# Patient Record
Sex: Female | Born: 1965 | Race: Black or African American | Hispanic: No | Marital: Single | Smoking: Never smoker
Health system: Southern US, Community
[De-identification: ages and names within clinical notes are randomized; demographics above are authoritative.]

## PROBLEM LIST (undated history)

## (undated) DIAGNOSIS — R002 Palpitations: Secondary | ICD-10-CM

## (undated) DIAGNOSIS — M539 Dorsopathy, unspecified: Secondary | ICD-10-CM

## (undated) DIAGNOSIS — Z803 Family history of malignant neoplasm of breast: Secondary | ICD-10-CM

## (undated) DIAGNOSIS — D249 Benign neoplasm of unspecified breast: Secondary | ICD-10-CM

## (undated) HISTORY — DX: Palpitations: R00.2

## (undated) HISTORY — PX: BACK SURGERY: SHX140

## (undated) HISTORY — DX: Benign neoplasm of unspecified breast: D24.9

## (undated) HISTORY — DX: Dorsopathy, unspecified: M53.9

## (undated) HISTORY — DX: Family history of malignant neoplasm of breast: Z80.3

---

## 2006-03-03 ENCOUNTER — Emergency Department: Payer: Self-pay | Admitting: Emergency Medicine

## 2007-07-04 ENCOUNTER — Emergency Department: Payer: Self-pay | Admitting: Emergency Medicine

## 2007-09-30 ENCOUNTER — Emergency Department: Payer: Self-pay | Admitting: Emergency Medicine

## 2008-10-18 ENCOUNTER — Emergency Department: Payer: Self-pay | Admitting: Emergency Medicine

## 2009-09-29 ENCOUNTER — Ambulatory Visit: Payer: Self-pay

## 2009-11-10 ENCOUNTER — Ambulatory Visit: Payer: Self-pay | Admitting: Unknown Physician Specialty

## 2010-06-20 ENCOUNTER — Emergency Department (HOSPITAL_COMMUNITY): Admission: EM | Admit: 2010-06-20 | Discharge: 2010-06-20 | Payer: Self-pay | Admitting: Emergency Medicine

## 2010-07-06 ENCOUNTER — Ambulatory Visit: Payer: Self-pay | Admitting: Family Medicine

## 2010-07-06 DIAGNOSIS — R413 Other amnesia: Secondary | ICD-10-CM

## 2010-07-06 DIAGNOSIS — G44319 Acute post-traumatic headache, not intractable: Secondary | ICD-10-CM

## 2010-07-06 DIAGNOSIS — Z9189 Other specified personal risk factors, not elsewhere classified: Secondary | ICD-10-CM | POA: Insufficient documentation

## 2010-07-06 DIAGNOSIS — S335XXA Sprain of ligaments of lumbar spine, initial encounter: Secondary | ICD-10-CM

## 2010-07-11 ENCOUNTER — Ambulatory Visit: Payer: Self-pay | Admitting: Family Medicine

## 2010-07-11 ENCOUNTER — Encounter (INDEPENDENT_AMBULATORY_CARE_PROVIDER_SITE_OTHER): Payer: Self-pay | Admitting: Family Medicine

## 2010-08-07 ENCOUNTER — Ambulatory Visit: Payer: Self-pay | Admitting: Family Medicine

## 2010-08-10 ENCOUNTER — Encounter: Payer: Self-pay | Admitting: Family Medicine

## 2010-08-10 ENCOUNTER — Telehealth (INDEPENDENT_AMBULATORY_CARE_PROVIDER_SITE_OTHER): Payer: Self-pay | Admitting: Family Medicine

## 2010-08-12 ENCOUNTER — Encounter: Payer: Self-pay | Admitting: Family Medicine

## 2010-08-31 ENCOUNTER — Telehealth: Payer: Self-pay | Admitting: Family Medicine

## 2010-09-05 ENCOUNTER — Ambulatory Visit: Payer: Self-pay | Admitting: Pain Medicine

## 2010-09-11 ENCOUNTER — Ambulatory Visit: Payer: Self-pay | Admitting: Pain Medicine

## 2010-09-12 ENCOUNTER — Encounter: Payer: Self-pay | Admitting: Family Medicine

## 2010-09-13 ENCOUNTER — Ambulatory Visit: Payer: Self-pay | Admitting: Pain Medicine

## 2010-10-10 ENCOUNTER — Ambulatory Visit: Payer: Self-pay | Admitting: Pain Medicine

## 2010-10-23 ENCOUNTER — Ambulatory Visit: Payer: Self-pay | Admitting: Pain Medicine

## 2010-11-02 ENCOUNTER — Ambulatory Visit: Payer: Self-pay | Admitting: Family Medicine

## 2010-11-02 DIAGNOSIS — J309 Allergic rhinitis, unspecified: Secondary | ICD-10-CM | POA: Insufficient documentation

## 2010-11-07 ENCOUNTER — Telehealth: Payer: Self-pay | Admitting: Family Medicine

## 2010-11-11 ENCOUNTER — Ambulatory Visit
Admission: RE | Admit: 2010-11-11 | Discharge: 2010-11-11 | Payer: Self-pay | Source: Home / Self Care | Attending: Internal Medicine | Admitting: Internal Medicine

## 2010-11-12 DIAGNOSIS — D249 Benign neoplasm of unspecified breast: Secondary | ICD-10-CM

## 2010-11-12 HISTORY — DX: Benign neoplasm of unspecified breast: D24.9

## 2010-11-12 HISTORY — PX: BREAST MASS EXCISION: SHX1267

## 2010-11-12 HISTORY — PX: LUMBAR EPIDURAL INJECTION: SHX1980

## 2010-11-12 HISTORY — PX: BREAST BIOPSY: SHX20

## 2010-11-28 ENCOUNTER — Ambulatory Visit: Payer: Self-pay | Admitting: Pain Medicine

## 2010-12-04 ENCOUNTER — Ambulatory Visit: Payer: Self-pay | Admitting: Pain Medicine

## 2010-12-12 NOTE — Progress Notes (Signed)
  Phone Note Call from Patient   Caller: Theresa Cobb Call For: DR. Maryln Manuel Reason for Call: Refill Medication Summary of Call: Patient called for a refill of Tramadol Initial call taken by: Dorna Leitz,  August 31, 2010 2:32 PM  Follow-up for Phone Call        I reviewed the patient's chart and I recommend that the patient follow up with her PCP and be seen for an evaluation.  Her PCP will advise her if she requires a referral to a neurologist or headache specialist.  Please call the patient and if patient is having severe symptoms, advise the patient to go to the ER for evaluation.  Pt would need an OV before any more refills of Tramadol.   Rodney Langton, M.D., F.A.A.F.P.  August 31, 2010   Additional Follow-up for Phone Call Additional follow up Details #1::        Phone Call Completed. Pt was notified of Dr. Barrett Shell. Johonson's advise. Oct. 20TH, 2011 Additional Follow-up by: Levonne Spiller EMT-P,  August 31, 2010 3:13 PM

## 2010-12-12 NOTE — Progress Notes (Signed)
Summary: Refill of tramadol.  Phone Note Call from Patient   Caller: Patient Call For: Medication refill Reason for Call: Refill Medication, Talk to Doctor Summary of Call: Patient reports that she finally went to physical therapy and her pain has actually worsened. I informed her that it may get worse before it gets better and she voiced understanding. She requested a refill of tramadol to take at night as needed for the pain. She takes exedrine back and body during the day at work as needed.     Prescriptions: ULTRACET 37.5-325 MG TABS (TRAMADOL-ACETAMINOPHEN) 1 by mouth three times a day as needed pain  #30 x 0   Entered and Authorized by:   Tacey Ruiz MD   Signed by:   Tacey Ruiz MD on 08/10/2010   Method used:   Electronically to        [facility] [address] (retail)       [address], [address]       [place], [place]  [address]       Ph: [phone]       Fax: [phone]   RxID:   [id number]  The risks, benefits and possible side effects of the treatments and tests were explained clearly to the patient and the patient verbalized understanding.

## 2010-12-12 NOTE — Assessment & Plan Note (Signed)
Summary: ear hurting,congested/jbb   Vital Signs:  Patient profile:   45 year old female Height:      67 inches Weight:      163 pounds O2 Sat:      97 % on Room air Temp:     98.0 degrees F oral Pulse rate:   94 / minute Pulse rhythm:   regular Resp:     18 per minute BP sitting:   136 / 85  (right arm)  Vitals Entered By: Levonne Spiller EMT-P (August 07, 2010 4:02 PM)  O2 Flow:  Room air  Reason for Visit URI, Left ear ache / rwt  Allergies (verified): No Known Drug Allergies PMH-FH-SH reviewed for relevance  Family History: Reviewed history and no changes required.  Social History: Reviewed history from 07/06/2010 and no changes required. Never Smoked Alcohol use-no Drug use-no   Complete Medication List: 1)  Ultracet 37.5-325 Mg Tabs (Tramadol-acetaminophen) .Marland Kitchen.. 1 by mouth three times a day as needed pain 2)  Zithromax Z-pak 250 Mg Tabs (Azithromycin) .... As directed by mouth for infection 3)  Promethazine-dm 6.25-15 Mg/1ml Syrp (Promethazine-dm) .Marland Kitchen.. 1-2 tsp by mouth q8hours as needed cough 4)  Mucinex D 60-600 Mg Xr12h-tab (Pseudoephedrine-guaifenesin) .Marland Kitchen.. 1 by mouth q 12 hours as needed nasal congestion Prescriptions: MUCINEX D 60-600 MG XR12H-TAB (PSEUDOEPHEDRINE-GUAIFENESIN) 1 by mouth q 12 hours as needed nasal congestion  #24 x 0   Entered and Authorized by:   Tacey Ruiz MD   Signed by:   Tacey Ruiz MD on 08/07/2010   Method used:   Electronically to        [facility] [address] (retail)       [address], [address]       [place], [place]  [address]       Ph: [phone]       Fax: [phone]   RxID:   [id number] PROMETHAZINE-DM 6.25-15 MG/5ML SYRP (PROMETHAZINE-DM) 1-2 tsp by mouth Q8hours as needed cough  #10ml x 0   Entered and Authorized by:   Tacey Ruiz MD   Signed by:   Tacey Ruiz MD on 08/07/2010   Method used:   Electronically to        [facility] [address] (retail)       [address],  [address]       [place], [place]  [address]       Ph: [phone]       Fax: [phone]   RxID:   [id number] ZITHROMAX Z-PAK 250 MG TABS (AZITHROMYCIN) as directed by mouth for infection  #1 x 0   Entered and Authorized by:   Tacey Ruiz MD   Signed by:   Tacey Ruiz MD on 08/07/2010   Method used:   Electronically to        [facility] [address] (retail)       [address], [address]       [place], [place]  [address]       Ph: [phone]       Fax: [phone]   RxID:   [id number]   History of Present Illness History from: patient Reason for visit: see chief complaint History of Present Illness: Fri or Sat started to have cough - nonproductive but keeps her up at night. She feels hot and then  cold. + nightsweats. L ear hurts no drainage. + bad headaches. + sneezing     REVIEW OF SYSTEMS Constitutional Symptoms       Complains of fever, chills, night sweats, and fatigue.     Denies weight loss and weight gain.  Eyes       Denies change in vision, eye pain, eye discharge, glasses, contact lenses, and eye surgery. Ear/Nose/Throat/Mouth       Complains of ear pain, dizziness, frequent runny nose, sinus problems, and tooth pain or bleeding.      Denies hearing loss/aids, change in hearing, ear discharge, frequent nose bleeds, sore throat, and hoarseness.  Respiratory       Complains of dry cough.      Denies productive cough, wheezing, shortness of breath, asthma, bronchitis, and emphysema/COPD.  Cardiovascular       Denies murmurs, chest pain, and tires easily with exhertion.    Gastrointestinal       Denies nausea/vomiting. Neurological       Complains of headaches.      Comments: different headaches than before   Assessment New Problems: ACUTE SINUSITIS, UNSPECIFIED (ICD-461.9)   Plan New Medications/Changes: MUCINEX D 60-600 MG XR12H-TAB (PSEUDOEPHEDRINE-GUAIFENESIN) 1 by mouth q 12 hours as needed  nasal congestion  #24 x 0, 08/07/2010, Tacey Ruiz MD PROMETHAZINE-DM 6.25-15 MG/5ML SYRP (PROMETHAZINE-DM) 1-2 tsp by mouth Q8hours as needed cough  #74ml x 0, 08/07/2010, Tacey Ruiz MD ZITHROMAX Z-PAK 250 MG TABS (AZITHROMYCIN) as directed by mouth for infection  #1 x 0, 08/07/2010, Tacey Ruiz MD  New Orders: Est. Patient Level III [phone]  The patient and/or caregiver has been counseled thoroughly with regard to medications prescribed including dosage, schedule, interactions, rationale for use, and possible side effects and they verbalize understanding.  Diagnoses and expected course of recovery discussed and will return if not improved as expected or if the condition worsens. Patient and/or caregiver verbalized understanding.   Physical Exam General appearance: well developed, well nourished, no acute distress Head: tender frontal > maxillary sinus tenderness Eyes: conjunctivae and lids normal Ears: normal, no lesions or deformities Oral/Pharynx: erythema with clear PND Neck: neck supple,  trachea midline, no masses Chest/Lungs: no rales, wheezes, or rhonchi bilateral, breath sounds equal without effort Heart: regular rate and  rhythm, no murmur Skin: no obvious rashes or lesions MSE: oriented to time, place, and person
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
## 2010-12-14 NOTE — Progress Notes (Signed)
  Phone Note Call from Patient   Caller: Patient Reason for Call: Refill Medication Summary of Call: Patient called stating her ears are still hurting.  She is still coughing up green mucus.  She needs a refill on Fluticasone and Azithromycin 250 mg tabs.  Theresa Cobb dob is 09/02/1966.  You can reach her at [phone].  She stated to call her before 5:00 today if she needs to come in to see the doctor. Initial call taken by: Dorna Leitz,  November 07, 2010 12:40 PM  Follow-up for Phone Call        Pt. was advised that the antibiotic will continue in her system for a few more days. If she feels that she needs to be seen? She can come into the clinic. Pt. was advised that if she is not any better in 2-3days or worse, To come into the clinic for re-evaluation. / rwt Follow-up by: Levonne Spiller EMT-P,  November 07, 2010 12:55 PM
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
## 2010-12-16 ENCOUNTER — Encounter: Payer: Self-pay | Admitting: Family Medicine

## 2010-12-16 ENCOUNTER — Ambulatory Visit: Payer: Self-pay | Admitting: Family Medicine

## 2010-12-16 DIAGNOSIS — J309 Allergic rhinitis, unspecified: Secondary | ICD-10-CM

## 2010-12-16 DIAGNOSIS — J019 Acute sinusitis, unspecified: Secondary | ICD-10-CM

## 2010-12-20 ENCOUNTER — Telehealth: Payer: Self-pay | Admitting: Family Medicine

## 2010-12-20 NOTE — Assessment & Plan Note (Signed)
Summary: POSSIBLE SINUS INFECTION/EVM   Vital Signs:  Patient profile:   45 year old female Height:      65.5 inches Weight:      154 pounds BMI:     25.33 O2 Sat:      100 % on Room air Pulse rate:   88 / minute Pulse rhythm:   regular BP sitting:   117 / 75  (left arm) Cuff size:   regular  O2 Flow:  Room air History of Present Illness History from: patient Reason for visit: see chief complaint Chief Complaint: vomiting yesterday, ear ache, sore throat History of Present Illness: The patient presented today because she was sent home from work with cough and head congestion. The patient says that the symptoms started yesterday.  She is having chills, ear pain and sinus pressure and drainage.  The patient says that she is taking Zyrtec but not taking her nasal spray and reports that she has been feeling well up until yesterday.  She says that she is not having any other symptoms at this time.  She reports that she feels like she felt with a sinus infection.  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      Current Medications (verified): 1)  Mucinex D 60-600 Mg Xr12h-Tab (Pseudoephedrine-Guaifenesin) .Marland Kitchen.. 1 By Mouth Q 12 Hours As Needed Nasal Congestion 2)  Fluticasone Propionate 50 Mcg/act Susp (Fluticasone Propionate) .... 2 Sprays Per Nostril Once Daily 3)  Cetirizine Hcl 10 Mg Tabs (Cetirizine Hcl) .... Take 1 By Mouth Daily As Needed For Allergies 4)  Ventolin Hfa 108 (90 Base) Mcg/act Aers (Albuterol Sulfate) .... 2 Puffs Every 4 Hours As Needed Cough, Wheezing, Sob 5)  Ultracet 37.5-325 Mg Tabs (Tramadol-Acetaminophen) .... Tablet By Mouth As Needed For Pain  Allergies (verified): No Known Drug Allergies  Past History:  Family History: Last updated: 11/02/2010 Mother - Breast Cancer   Social History: Last updated: 12/16/2010 Never Smoked Alcohol use-no Drug  use-no Occupation: Works at [employer]  Risk Factors: Smoking Status: never (07/06/2010)  Past Medical History: Reviewed history from 11/02/2010 and no changes required. Allergic rhinitis Chronic Lower Back Pain on epidural injections Memory Problems  Past Surgical History: Reviewed history from 07/06/2010 and no changes required. Denies surgical history  Family History: Reviewed history from 11/02/2010 and no changes required. Mother - Breast Cancer   Social History: Never Smoked Alcohol use-no Drug use-no Occupation: Works at [employer]  Review of [employer]:  Denies chills, fatigue, fever, loss of appetite, malaise, sleep disorder, sweats, weakness, and weight loss. Eyes:  Denies blurring, discharge, double vision, eye irritation, eye pain, halos, itching, light sensitivity, red eye, vision loss-1 eye, and vision loss-both eyes. ENT:  Complains of earache and sore throat; denies decreased hearing, difficulty swallowing, ear discharge, hoarseness, nasal congestion, nosebleeds, postnasal drainage, ringing in ears, and sinus pressure. CV:  Denies bluish discoloration of lips or nails, chest pain or discomfort, difficulty breathing at night, difficulty breathing while lying down, fainting, fatigue, leg cramps with exertion, lightheadness, near fainting, palpitations, shortness of breath with exertion, swelling of feet, swelling of hands, and weight gain. Resp:  Denies chest discomfort, chest pain with inspiration, cough, coughing up blood, excessive snoring, hypersomnolence, morning headaches, pleuritic, shortness of breath, sputum productive, and wheezing. GI:  Denies abdominal pain, bloody stools, change in bowel habits, constipation, dark tarry stools, diarrhea, excessive appetite,  gas, hemorrhoids, indigestion, loss of appetite, nausea, vomiting, vomiting blood, and yellowish skin color. GU:  Denies abnormal vaginal bleeding, decreased libido, discharge, dysuria, genital sores,  hematuria, incontinence, nocturia, urinary frequency, and urinary hesitancy. MS:  Denies joint pain, joint redness, joint swelling, loss of strength, low back pain, mid back pain, muscle aches, muscle , cramps, muscle weakness, stiffness, and thoracic pain. Neuro:  Denies brief paralysis, difficulty with concentration, disturbances in coordination, falling down, headaches, inability to speak, memory loss, numbness, poor balance, seizures, sensation of room spinning, tingling, tremors, visual disturbances, and weakness. Psych:  Denies alternate hallucination ( auditory/visual), anxiety, depression, easily angered, easily tearful, irritability, mental problems, panic attacks, sense of great danger, suicidal thoughts/plans, thoughts of violence, unusual visions or sounds, and thoughts /plans of harming others. Allergy:  Complains of persistent infections, seasonal allergies, and sneezing.  Physical Exam  General:  Well-developed,well-nourished,in no acute distress; alert,appropriate and cooperative throughout examination Head:  Normocephalic and atraumatic without obvious abnormalities. No apparent alopecia or balding. Eyes:  No corneal or conjunctival inflammation noted. EOMI. Perrla. Funduscopic exam benign, without hemorrhages, exudates or papilledema. Vision grossly normal. Ears:  External ear exam shows no significant lesions or deformities.  Otoscopic examination reveals clear canals, tympanic membranes are intact bilaterally without bulging, retraction, inflammation or discharge. Hearing is grossly normal bilaterally. Nose:  large swollen red nasal turbinates bilateral, nasal discharge mucosal pallor and mucosal edema.   Mouth:  Oral mucosa and oropharynx without lesions or exudates.  Teeth in good repair. Neck:  No deformities, masses, or tenderness noted. Lungs:  Normal respiratory effort, chest expands symmetrically. Lungs are clear to auscultation, no crackles or wheezes. Heart:  Normal rate  and regular rhythm. S1 and S2 normal without gallop, murmur, click, rub or other extra sounds. Neurologic:  No cranial nerve deficits noted. Station and gait are normal.  Sensory, motor and coordinative functions appear intact. Cervical Nodes:  mild tender anterior cervical nodes noted Psych:  Cognition and judgment appear intact. Alert and cooperative with normal attention span and concentration. No apparent delusions, illusions, hallucinations   Problems:  Medical Problems Added: 1)  ? of Acute Sinusitis, Unspecified  (ICD-461.9)  Impression & Recommendations:  Problem # 1:  ALLERGIC RHINITIS CAUSE UNSPECIFIED (ICD-477.9)  Her updated medication list for this problem includes:    Fluticasone Propionate 50 Mcg/act Susp (Fluticasone propionate) .Marland Kitchen... 2 sprays per nostril once daily    Cetirizine Hcl 10 Mg Tabs (Cetirizine hcl) .Marland Kitchen... Take 1 by mouth daily as needed for allergies    Astelin 137 Mcg/spray Soln (Azelastine hcl) .Marland Kitchen... Take 1 spray in each nostril once daily before bed    Fluticasone Propionate 50 Mcg/act Susp (Fluticasone propionate) .Marland Kitchen... 2 sprays per nostril every morning once per day  Problem # 2:  ? of ACUTE SINUSITIS, UNSPECIFIED (ICD-461.9) I explained to the patient that I did not believe that she currently had a sinus infection but that she was at high risk for developing a sinus infection and that if she developed symptoms to go ahead and get the prescription.  The following medications were removed from the medication list:    Promethazine-dm 6.25-15 Mg/69ml Syrp (Promethazine-dm) .Marland Kitchen... 1-2 tsp by mouth q8hours as needed cough Her updated medication list for this problem includes:    Mucinex D 60-600 Mg Xr12h-tab (Pseudoephedrine-guaifenesin) .Marland Kitchen... 1 by mouth q 12 hours as needed nasal congestion    Fluticasone Propionate 50 Mcg/act Susp (Fluticasone propionate) .Marland Kitchen... 2 sprays per nostril once daily    Amoxicillin 875  Mg Tabs (Amoxicillin) .Marland Kitchen... Take 1 by mouth two  times a day until completed    Astelin 137 Mcg/spray Soln (Azelastine hcl) .Marland Kitchen... Take 1 spray in each nostril once daily before bed    Zyrtec-d Allergy & Congestion 5-120 Mg Xr12h-tab (Cetirizine-pseudoephedrine) .Marland Kitchen... Take 1 by mouth every 12 hours    Fluticasone Propionate 50 Mcg/act Susp (Fluticasone propionate) .Marland Kitchen... 2 sprays per nostril every morning once per day  Complete Medication List: 1)  Mucinex D 60-600 Mg Xr12h-tab (Pseudoephedrine-guaifenesin) .Marland Kitchen.. 1 by mouth q 12 hours as needed nasal congestion 2)  Fluticasone Propionate 50 Mcg/act Susp (Fluticasone propionate) .... 2 sprays per nostril once daily 3)  Cetirizine Hcl 10 Mg Tabs (Cetirizine hcl) .... Take 1 by mouth daily as needed for allergies 4)  Ventolin Hfa 108 (90 Base) Mcg/act Aers (Albuterol sulfate) .... 2 puffs every 4 hours as needed cough, wheezing, sob 5)  Ultracet 37.5-325 Mg Tabs (Tramadol-acetaminophen) .... Tablet by mouth as needed for pain 6)  Amoxicillin 875 Mg Tabs (Amoxicillin) .... Take 1 by mouth two times a day until completed 7)  Astelin 137 Mcg/spray Soln (Azelastine hcl) .... Take 1 spray in each nostril once daily before bed 8)  Zyrtec-d Allergy & Congestion 5-120 Mg Xr12h-tab (Cetirizine-pseudoephedrine) .... Take 1 by mouth every 12 hours 9)  Fluticasone Propionate 50 Mcg/act Susp (Fluticasone propionate) .... 2 sprays per nostril every morning once per day  Patient Instructions: 1)  Oral Rehydration Solution: drink 1/2 ounce every 15 minutes. If tolerated afert 1 hour, drink 1 ounce every 15 minutes. As you can tolerate, keep adding 1/2 ounce every 15 minutes, up to a total of 2-4 ounces. Contact the office if unable to tolerate oral solution, if you keep vomiting, or you continue to have signs of dehydration. 2)  Take your antibiotic as prescribed until ALL of it is gone, but stop if you develop a rash or swelling and contact our office as soon as possible. 3)  Acute sinusitis symptoms for less than  10 days are not helped by antibiotics.Use warm moist compresses, and over the counter decongestants ( only as directed). Call if no improvement in 5-7 days, sooner if increasing pain, fever, or new symptoms. 4)  Go to the pharmacy and pick up your prescription (s).  It may take up to 30 mins for electronic prescriptions to be delivered to the pharmacy.  Please call if your pharmacy has not received your prescriptions after 30 minutes.   5)  Return or go to the ER if no improvement or symptoms getting worse.   6)  The risks, benefits and possible side effects were clearly explained and discussed with the patient.  The patient verbalized clear understanding.  The patient was given instructions to return if symptoms don't improve, worsen or new changes develop.  If it is not during clinic hours and the patient cannot get back to this clinic then the patient was told to seek medical care at an available urgent care or emergency department.  The patient verbalized understanding.  7)  The patient was informed that there is no on-call provider or services available at this clinic during off-hours (when the clinic is closed).  If the patient developed a problem or concern that required immediate attention, the patient was advised to go the the nearest available urgent care or emergency department for medical care.  The patient verbalized understanding.    Prescriptions: FLUTICASONE PROPIONATE 50 MCG/ACT SUSP (FLUTICASONE PROPIONATE) 2 sprays per nostril every morning  once per day  #1 x 0   Entered and Authorized by:   Standley Dakins MD   Signed by:   Standley Dakins MD on 12/16/2010   Method used:   Electronically to        [facility] [address] (retail)       [address], [address]       [place], [place]  [address]       Ph: [phone]       Fax: [phone]   RxID:   [id number] ZYRTEC-D ALLERGY & CONGESTION 5-120 MG XR12H-TAB (CETIRIZINE-PSEUDOEPHEDRINE) take 1 by  mouth every 12 hours  #6 x 0   Entered and Authorized by:   Standley Dakins MD   Signed by:   Standley Dakins MD on 12/16/2010   Method used:   Electronically to        [facility] [address] ([facility]ail)       [address], [address]       [place], [place]  [address]       Ph: [phone]       Fax: [phone]   RxID:   [id number] ASTELIN 137 MCG/SPRAY SOLN (AZELASTINE HCL) take 1 spray in each nostril once daily before bed  #1 x 0   Entered and Authorized by:   Standley Dakins MD   Signed by:   Standley Dakins MD on 12/16/2010   Method used:   Electronically to        [facility] [address] ([facility]ail)       [address], [address]       [place], [place]  [address]       Ph: [phone]       Fax: [phone]   RxID:   [id number] AMOXICILLIN 875 MG TABS (AMOXICILLIN) take 1 by mouth two times a day until completed  #20 x 0   Entered and Authorized by:   Standley Dakins MD   Signed by:   Standley Dakins MD on 12/16/2010   Method used:   Electronically to        [facility] [address] ([facility]ail)       [address], [address]       [place], [place]  [address]       Ph: [phone]       Fax: [phone]   RxID:   [id number]

## 2010-12-28 NOTE — Progress Notes (Signed)
       New/Updated Medications: AZITHROMYCIN 250 MG TABS (AZITHROMYCIN) take 2 tabs by mouth on day 1, then take 1 tab by mouth daily until completed Prescriptions: AZITHROMYCIN 250 MG TABS (AZITHROMYCIN) take 2 tabs by mouth on day 1, then take 1 tab by mouth daily until completed  #6 x 0   Entered and Authorized by:   Standley Dakins MD   Signed by:   Standley Dakins MD on 12/20/2010   Method used:   Electronically to        [facility] (retail)       [address], [address]       [place], [place]  [address]       Ph: [phone]       Fax: [phone]   RxID:   [id number]   The patient came in today requesting that she have an Rx for a Z pack sent to the pharmacy.  She said that she was sent home from work today with fever.  I told the patient that we will send the Rx in but if no improvement after 3-5 days then patient will need to come in for another office visit.  The patient verbalized clear understanding.  Rodney Langton, MD, CDE, [employer]

## 2011-01-02 ENCOUNTER — Ambulatory Visit: Payer: Self-pay | Admitting: Pain Medicine

## 2011-01-15 ENCOUNTER — Ambulatory Visit: Payer: Self-pay | Admitting: Pain Medicine

## 2011-01-22 ENCOUNTER — Emergency Department (HOSPITAL_COMMUNITY)
Admission: EM | Admit: 2011-01-22 | Discharge: 2011-01-22 | Disposition: A | Discharge status: Home / Self Care | Payer: Medicaid Other | Attending: Emergency Medicine | Admitting: Emergency Medicine

## 2011-01-22 DIAGNOSIS — Z79899 Other long term (current) drug therapy: Secondary | ICD-10-CM | POA: Insufficient documentation

## 2011-01-22 DIAGNOSIS — M545 Low back pain, unspecified: Secondary | ICD-10-CM | POA: Insufficient documentation

## 2011-01-22 DIAGNOSIS — G8929 Other chronic pain: Secondary | ICD-10-CM | POA: Insufficient documentation

## 2011-01-22 DIAGNOSIS — R110 Nausea: Secondary | ICD-10-CM | POA: Insufficient documentation

## 2011-01-26 LAB — URINALYSIS, ROUTINE W REFLEX MICROSCOPIC
Bilirubin Urine: NEGATIVE
Glucose, UA: NEGATIVE mg/dL
Leukocytes, UA: NEGATIVE
Protein, ur: NEGATIVE mg/dL
Urobilinogen, UA: 0.2 mg/dL (ref 0.0–1.0)
pH: 6 (ref 5.0–8.0)

## 2011-01-26 LAB — URINE MICROSCOPIC-ADD ON

## 2011-02-15 ENCOUNTER — Ambulatory Visit: Payer: Self-pay | Admitting: Pain Medicine

## 2011-02-22 ENCOUNTER — Ambulatory Visit: Payer: Self-pay

## 2011-02-26 ENCOUNTER — Ambulatory Visit: Payer: Self-pay | Admitting: Pain Medicine

## 2011-03-06 ENCOUNTER — Ambulatory Visit: Payer: Self-pay | Admitting: Family Medicine

## 2011-03-20 ENCOUNTER — Ambulatory Visit: Payer: Self-pay | Admitting: Pain Medicine

## 2011-03-22 ENCOUNTER — Ambulatory Visit: Payer: Self-pay | Admitting: General Surgery

## 2011-03-23 LAB — PATHOLOGY REPORT

## 2011-03-26 ENCOUNTER — Ambulatory Visit: Payer: Self-pay | Admitting: Pain Medicine

## 2011-05-01 ENCOUNTER — Ambulatory Visit: Payer: Self-pay | Admitting: Pain Medicine

## 2011-05-09 ENCOUNTER — Ambulatory Visit: Payer: Self-pay | Admitting: Pain Medicine

## 2011-05-31 ENCOUNTER — Ambulatory Visit: Payer: Self-pay | Admitting: Pain Medicine

## 2011-06-11 ENCOUNTER — Ambulatory Visit: Payer: Self-pay | Admitting: Pain Medicine

## 2011-07-04 ENCOUNTER — Other Ambulatory Visit: Payer: Self-pay | Admitting: Neurosurgery

## 2011-07-04 DIAGNOSIS — M549 Dorsalgia, unspecified: Secondary | ICD-10-CM

## 2011-07-04 DIAGNOSIS — M5410 Radiculopathy, site unspecified: Secondary | ICD-10-CM

## 2011-07-10 ENCOUNTER — Ambulatory Visit: Payer: Self-pay | Admitting: Pain Medicine

## 2011-07-13 ENCOUNTER — Other Ambulatory Visit: Payer: Medicaid Other

## 2011-07-18 ENCOUNTER — Ambulatory Visit: Payer: Self-pay | Admitting: Pain Medicine

## 2011-08-14 ENCOUNTER — Ambulatory Visit: Payer: Self-pay | Admitting: Pain Medicine

## 2011-08-20 ENCOUNTER — Ambulatory Visit: Payer: Self-pay | Admitting: Pain Medicine

## 2011-08-22 ENCOUNTER — Emergency Department: Payer: Self-pay | Admitting: Internal Medicine

## 2011-09-11 ENCOUNTER — Ambulatory Visit: Payer: Self-pay | Admitting: Pain Medicine

## 2011-09-12 ENCOUNTER — Ambulatory Visit: Payer: Self-pay | Admitting: Pain Medicine

## 2011-10-16 ENCOUNTER — Ambulatory Visit: Payer: Self-pay | Admitting: Pain Medicine

## 2011-10-24 ENCOUNTER — Ambulatory Visit: Payer: Self-pay | Admitting: Pain Medicine

## 2011-11-27 ENCOUNTER — Ambulatory Visit: Payer: Self-pay | Admitting: Pain Medicine

## 2011-12-05 ENCOUNTER — Ambulatory Visit: Payer: Self-pay | Admitting: Pain Medicine

## 2012-01-03 ENCOUNTER — Ambulatory Visit: Payer: Self-pay | Admitting: Pain Medicine

## 2012-01-11 ENCOUNTER — Emergency Department: Payer: Self-pay | Admitting: Emergency Medicine

## 2012-01-16 ENCOUNTER — Ambulatory Visit: Payer: Self-pay | Admitting: Pain Medicine

## 2012-01-25 ENCOUNTER — Ambulatory Visit: Payer: Self-pay | Admitting: Pain Medicine

## 2012-02-03 ENCOUNTER — Emergency Department: Payer: Self-pay | Admitting: Emergency Medicine

## 2012-02-04 ENCOUNTER — Ambulatory Visit: Payer: Self-pay | Admitting: Pain Medicine

## 2012-02-12 ENCOUNTER — Ambulatory Visit: Payer: Self-pay | Admitting: Pain Medicine

## 2012-02-25 ENCOUNTER — Ambulatory Visit: Payer: Self-pay | Admitting: General Surgery

## 2012-02-28 ENCOUNTER — Ambulatory Visit: Payer: Self-pay | Admitting: Pain Medicine

## 2012-03-12 ENCOUNTER — Ambulatory Visit: Payer: Self-pay | Admitting: Pain Medicine

## 2012-04-03 ENCOUNTER — Ambulatory Visit: Payer: Self-pay | Admitting: Pain Medicine

## 2012-04-14 ENCOUNTER — Ambulatory Visit: Payer: Self-pay | Admitting: Pain Medicine

## 2012-05-01 ENCOUNTER — Ambulatory Visit: Payer: Self-pay | Admitting: Pain Medicine

## 2012-05-07 ENCOUNTER — Ambulatory Visit: Payer: Self-pay | Admitting: Pain Medicine

## 2012-05-29 ENCOUNTER — Ambulatory Visit: Payer: Self-pay | Admitting: Pain Medicine

## 2012-06-04 ENCOUNTER — Ambulatory Visit: Payer: Self-pay | Admitting: Pain Medicine

## 2012-06-26 ENCOUNTER — Ambulatory Visit: Payer: Self-pay | Admitting: Pain Medicine

## 2012-07-16 ENCOUNTER — Ambulatory Visit: Payer: Self-pay | Admitting: Pain Medicine

## 2012-08-14 ENCOUNTER — Ambulatory Visit: Payer: Self-pay | Admitting: Pain Medicine

## 2012-09-18 ENCOUNTER — Ambulatory Visit: Payer: Self-pay | Admitting: Pain Medicine

## 2012-10-20 ENCOUNTER — Ambulatory Visit: Payer: Self-pay | Admitting: Pain Medicine

## 2012-11-18 ENCOUNTER — Ambulatory Visit: Payer: Self-pay | Admitting: Pain Medicine

## 2012-11-19 ENCOUNTER — Ambulatory Visit: Payer: Self-pay | Admitting: Pain Medicine

## 2012-12-18 ENCOUNTER — Ambulatory Visit: Payer: Self-pay | Admitting: Pain Medicine

## 2012-12-21 ENCOUNTER — Encounter: Payer: Self-pay | Admitting: *Deleted

## 2012-12-21 DIAGNOSIS — D249 Benign neoplasm of unspecified breast: Secondary | ICD-10-CM | POA: Insufficient documentation

## 2012-12-24 ENCOUNTER — Ambulatory Visit: Payer: Self-pay | Admitting: Pain Medicine

## 2013-01-12 ENCOUNTER — Ambulatory Visit: Payer: Self-pay | Admitting: Pain Medicine

## 2013-01-26 ENCOUNTER — Ambulatory Visit: Payer: Self-pay | Admitting: Pain Medicine

## 2013-02-16 ENCOUNTER — Ambulatory Visit: Payer: Self-pay | Admitting: Pain Medicine

## 2013-02-23 ENCOUNTER — Ambulatory Visit: Payer: Self-pay | Admitting: Pain Medicine

## 2013-02-26 ENCOUNTER — Ambulatory Visit: Payer: Self-pay | Admitting: General Surgery

## 2013-03-02 ENCOUNTER — Encounter: Payer: Self-pay | Admitting: *Deleted

## 2013-03-03 ENCOUNTER — Encounter: Payer: Self-pay | Admitting: General Surgery

## 2013-03-03 NOTE — Progress Notes (Signed)
Quick Note:  Make sure the additional views are done prior to her office visit ______ 

## 2013-03-04 NOTE — Progress Notes (Signed)
Patient to call [facility] to arrange additional views. She will call the office back to reschedule office visit follow up.

## 2013-03-05 ENCOUNTER — Ambulatory Visit: Payer: Self-pay | Admitting: General Surgery

## 2013-03-10 ENCOUNTER — Ambulatory Visit: Payer: Self-pay | Admitting: General Surgery

## 2013-03-12 ENCOUNTER — Ambulatory Visit: Payer: Self-pay | Admitting: Pain Medicine

## 2013-03-12 ENCOUNTER — Encounter: Payer: Self-pay | Admitting: *Deleted

## 2013-03-17 ENCOUNTER — Encounter: Payer: Self-pay | Admitting: General Surgery

## 2013-03-23 ENCOUNTER — Ambulatory Visit: Payer: Self-pay | Admitting: Pain Medicine

## 2013-04-16 ENCOUNTER — Ambulatory Visit: Payer: Self-pay | Admitting: Pain Medicine

## 2013-04-27 ENCOUNTER — Ambulatory Visit: Payer: Self-pay | Admitting: Pain Medicine

## 2013-05-12 ENCOUNTER — Ambulatory Visit: Payer: Self-pay | Admitting: Pain Medicine

## 2013-06-03 ENCOUNTER — Ambulatory Visit: Payer: Self-pay | Admitting: Pain Medicine

## 2013-06-10 ENCOUNTER — Ambulatory Visit: Payer: Self-pay | Admitting: Pain Medicine

## 2013-07-06 ENCOUNTER — Other Ambulatory Visit: Payer: Self-pay | Admitting: Neurosurgery

## 2013-07-06 LAB — HCG, QUANTITATIVE, PREGNANCY: Beta Hcg, Quant.: <1 m[IU]/mL — ABNORMAL LOW

## 2013-07-07 ENCOUNTER — Ambulatory Visit: Payer: Self-pay | Admitting: Neurosurgery

## 2013-07-08 ENCOUNTER — Ambulatory Visit: Payer: Self-pay | Admitting: Pain Medicine

## 2013-07-15 ENCOUNTER — Encounter: Payer: Self-pay | Admitting: Neurosurgery

## 2013-08-13 ENCOUNTER — Ambulatory Visit: Payer: Self-pay | Admitting: Pain Medicine

## 2013-08-14 ENCOUNTER — Encounter: Payer: Self-pay | Admitting: Neurosurgery

## 2013-08-24 ENCOUNTER — Ambulatory Visit: Payer: Self-pay | Admitting: Pain Medicine

## 2013-09-08 ENCOUNTER — Ambulatory Visit: Payer: Self-pay | Admitting: Pain Medicine

## 2013-09-28 ENCOUNTER — Ambulatory Visit: Payer: Self-pay | Admitting: Pain Medicine

## 2013-10-29 ENCOUNTER — Ambulatory Visit: Payer: Self-pay | Admitting: Pain Medicine

## 2013-11-09 ENCOUNTER — Ambulatory Visit: Payer: Self-pay | Admitting: Pain Medicine

## 2013-12-01 ENCOUNTER — Ambulatory Visit: Payer: Self-pay | Admitting: Pain Medicine

## 2013-12-07 ENCOUNTER — Ambulatory Visit: Payer: Self-pay | Admitting: Pain Medicine

## 2013-12-28 ENCOUNTER — Encounter (HOSPITAL_COMMUNITY): Payer: Self-pay | Admitting: Emergency Medicine

## 2013-12-28 ENCOUNTER — Emergency Department (HOSPITAL_COMMUNITY)
Admission: EM | Admit: 2013-12-28 | Discharge: 2013-12-28 | Disposition: A | Discharge status: Home / Self Care | Payer: Medicaid Other | Attending: Emergency Medicine | Admitting: Emergency Medicine

## 2013-12-28 ENCOUNTER — Emergency Department (HOSPITAL_COMMUNITY): Payer: Medicaid Other

## 2013-12-28 DIAGNOSIS — S8990XA Unspecified injury of unspecified lower leg, initial encounter: Secondary | ICD-10-CM | POA: Insufficient documentation

## 2013-12-28 DIAGNOSIS — S99929A Unspecified injury of unspecified foot, initial encounter: Secondary | ICD-10-CM

## 2013-12-28 DIAGNOSIS — Y9389 Activity, other specified: Secondary | ICD-10-CM | POA: Insufficient documentation

## 2013-12-28 DIAGNOSIS — Y9289 Other specified places as the place of occurrence of the external cause: Secondary | ICD-10-CM | POA: Insufficient documentation

## 2013-12-28 DIAGNOSIS — S335XXA Sprain of ligaments of lumbar spine, initial encounter: Secondary | ICD-10-CM | POA: Insufficient documentation

## 2013-12-28 DIAGNOSIS — S99919A Unspecified injury of unspecified ankle, initial encounter: Secondary | ICD-10-CM

## 2013-12-28 DIAGNOSIS — S39012A Strain of muscle, fascia and tendon of lower back, initial encounter: Secondary | ICD-10-CM

## 2013-12-28 DIAGNOSIS — Z8742 Personal history of other diseases of the female genital tract: Secondary | ICD-10-CM | POA: Insufficient documentation

## 2013-12-28 DIAGNOSIS — S0990XA Unspecified injury of head, initial encounter: Secondary | ICD-10-CM | POA: Insufficient documentation

## 2013-12-28 DIAGNOSIS — Z79899 Other long term (current) drug therapy: Secondary | ICD-10-CM | POA: Insufficient documentation

## 2013-12-28 DIAGNOSIS — Z9889 Other specified postprocedural states: Secondary | ICD-10-CM | POA: Insufficient documentation

## 2013-12-28 MED ORDER — IBUPROFEN 600 MG PO TABS: 600.0000 mg | ORAL_TABLET | Freq: Four times a day (QID) | ORAL | Status: DC | PRN

## 2013-12-28 MED ORDER — CYCLOBENZAPRINE HCL 10 MG PO TABS: 10.0000 mg | ORAL_TABLET | Freq: Two times a day (BID) | ORAL | Status: DC | PRN

## 2013-12-28 MED ORDER — OXYCODONE-ACETAMINOPHEN 5-325 MG PO TABS
1.0000 | ORAL_TABLET | Freq: Once | ORAL | Status: AC
  Administered 2013-12-28: 1 via ORAL
  Filled 2013-12-28: qty 1

## 2013-12-28 MED ORDER — HYDROCODONE-ACETAMINOPHEN 5-325 MG PO TABS: 1.0000 | ORAL_TABLET | ORAL | Status: DC | PRN
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
## 2013-12-29 NOTE — ED Provider Notes (Signed)
Medical screening examination/treatment/procedure(s) were performed by non-physician practitioner and as supervising physician I was immediately available for consultation/collaboration.  EKG Interpretation   None        Virgel Manifold, MD 12/29/13 1324

## 2013-12-31 ENCOUNTER — Ambulatory Visit: Payer: Self-pay | Admitting: Pain Medicine

## 2014-01-04 ENCOUNTER — Ambulatory Visit: Payer: Self-pay | Admitting: Pain Medicine

## 2014-02-02 ENCOUNTER — Ambulatory Visit: Payer: Self-pay | Admitting: Pain Medicine

## 2014-03-01 ENCOUNTER — Ambulatory Visit: Payer: Self-pay | Admitting: Family Medicine

## 2014-03-02 ENCOUNTER — Ambulatory Visit: Payer: Self-pay | Admitting: Pain Medicine

## 2014-03-09 ENCOUNTER — Encounter: Payer: Self-pay | Admitting: Neurosurgery

## 2014-03-10 ENCOUNTER — Ambulatory Visit: Payer: Self-pay | Admitting: Pain Medicine

## 2014-03-24 ENCOUNTER — Ambulatory Visit: Payer: Self-pay | Admitting: Family Medicine

## 2014-03-31 ENCOUNTER — Ambulatory Visit: Payer: Self-pay | Admitting: Pain Medicine

## 2014-04-12 ENCOUNTER — Ambulatory Visit: Payer: Self-pay | Admitting: Pain Medicine

## 2014-04-29 ENCOUNTER — Ambulatory Visit: Payer: Self-pay | Admitting: Pain Medicine

## 2014-05-27 ENCOUNTER — Ambulatory Visit: Payer: Self-pay | Admitting: Pain Medicine

## 2014-06-02 ENCOUNTER — Ambulatory Visit: Payer: Self-pay | Admitting: Pain Medicine

## 2014-06-28 ENCOUNTER — Ambulatory Visit: Payer: Self-pay | Admitting: Pain Medicine

## 2014-07-27 ENCOUNTER — Ambulatory Visit: Payer: Self-pay | Admitting: Pain Medicine

## 2014-08-23 ENCOUNTER — Ambulatory Visit: Payer: Self-pay | Admitting: Pain Medicine

## 2014-09-13 ENCOUNTER — Encounter (HOSPITAL_COMMUNITY): Payer: Self-pay | Admitting: Emergency Medicine

## 2014-09-14 ENCOUNTER — Ambulatory Visit: Payer: Self-pay | Admitting: Pain Medicine

## 2014-10-19 ENCOUNTER — Ambulatory Visit: Payer: Self-pay | Admitting: Pain Medicine

## 2014-11-08 ENCOUNTER — Ambulatory Visit: Payer: Self-pay | Admitting: Pain Medicine

## 2014-12-20 ENCOUNTER — Ambulatory Visit: Payer: Self-pay | Admitting: Pain Medicine

## 2015-01-18 ENCOUNTER — Ambulatory Visit: Payer: Self-pay | Admitting: Pain Medicine

## 2015-02-17 ENCOUNTER — Ambulatory Visit
Admit: 2015-02-17 | Disposition: A | Discharge status: Home / Self Care | Payer: Self-pay | Attending: Pain Medicine | Admitting: Pain Medicine

## 2015-02-28 ENCOUNTER — Ambulatory Visit
Admit: 2015-02-28 | Disposition: A | Discharge status: Home / Self Care | Payer: Self-pay | Attending: Family Medicine | Admitting: Family Medicine

## 2015-03-18 DIAGNOSIS — M5137 Other intervertebral disc degeneration, lumbosacral region: Secondary | ICD-10-CM | POA: Insufficient documentation

## 2015-03-18 DIAGNOSIS — M1990 Unspecified osteoarthritis, unspecified site: Secondary | ICD-10-CM | POA: Insufficient documentation

## 2015-03-18 DIAGNOSIS — M5481 Occipital neuralgia: Secondary | ICD-10-CM | POA: Insufficient documentation

## 2015-03-18 DIAGNOSIS — M533 Sacrococcygeal disorders, not elsewhere classified: Secondary | ICD-10-CM | POA: Insufficient documentation

## 2015-03-18 NOTE — Patient Instructions (Addendum)
Continue present medications and take prescribed antibiotic.  F/U Dr Rebeca Alert or Dr Quay Burow for eval of BP and general condition.  F/U Dr Mauri Reading for cardiological  eval.   F/U Dr Radford Pax for neurosurgical eval.  May consider radiofrequency rhizolysis, intraspinal implantation, and other treatment.   Call Pain Management for any concerns prior to return appointment.

## 2015-03-21 ENCOUNTER — Encounter: Payer: Medicaid Other | Admitting: Pain Medicine

## 2015-03-22 NOTE — Progress Notes (Signed)
This encounter was created in error - please disregard.

## 2015-03-23 ENCOUNTER — Encounter: Payer: Self-pay | Admitting: Pain Medicine

## 2015-03-23 ENCOUNTER — Ambulatory Visit: Payer: Medicaid Other | Attending: Pain Medicine | Admitting: Pain Medicine

## 2015-03-23 VITALS — BP 115/77 | HR 89 | Temp 97.9°F | Resp 14 | Ht 66.0 in | Wt 160.0 lb

## 2015-03-23 DIAGNOSIS — M5126 Other intervertebral disc displacement, lumbar region: Secondary | ICD-10-CM | POA: Diagnosis not present

## 2015-03-23 DIAGNOSIS — M545 Low back pain: Secondary | ICD-10-CM | POA: Diagnosis present

## 2015-03-23 DIAGNOSIS — M47816 Spondylosis without myelopathy or radiculopathy, lumbar region: Secondary | ICD-10-CM

## 2015-03-23 DIAGNOSIS — M5416 Radiculopathy, lumbar region: Secondary | ICD-10-CM

## 2015-03-23 DIAGNOSIS — M5136 Other intervertebral disc degeneration, lumbar region: Secondary | ICD-10-CM | POA: Insufficient documentation

## 2015-03-23 DIAGNOSIS — M961 Postlaminectomy syndrome, not elsewhere classified: Secondary | ICD-10-CM

## 2015-03-23 DIAGNOSIS — M461 Sacroiliitis, not elsewhere classified: Secondary | ICD-10-CM

## 2015-03-23 DIAGNOSIS — M4806 Spinal stenosis, lumbar region: Secondary | ICD-10-CM | POA: Insufficient documentation

## 2015-03-23 DIAGNOSIS — M47818 Spondylosis without myelopathy or radiculopathy, sacral and sacrococcygeal region: Secondary | ICD-10-CM

## 2015-03-23 MED ORDER — TRIAMCINOLONE ACETONIDE 40 MG/ML IJ SUSP
INTRAMUSCULAR | Status: AC
  Administered 2015-03-23: 10:00:00
  Filled 2015-03-23: qty 1

## 2015-03-23 MED ORDER — TIZANIDINE HCL 2 MG PO TABS: ORAL_TABLET | ORAL | Status: DC

## 2015-03-23 MED ORDER — FENTANYL CITRATE (PF) 100 MCG/2ML IJ SOLN
INTRAMUSCULAR | Status: AC
  Administered 2015-03-23: 100 ug
  Filled 2015-03-23: qty 2

## 2015-03-23 MED ORDER — ORPHENADRINE CITRATE 30 MG/ML IJ SOLN
INTRAMUSCULAR | Status: AC
  Administered 2015-03-23: 10:00:00
  Filled 2015-03-23: qty 2

## 2015-03-23 MED ORDER — DICLOFENAC SODIUM 1 % TD GEL: TRANSDERMAL | Status: DC

## 2015-03-23 MED ORDER — GABAPENTIN 400 MG PO CAPS: ORAL_CAPSULE | ORAL | Status: DC

## 2015-03-23 MED ORDER — LIDOCAINE HCL (PF) 1 % IJ SOLN
INTRAMUSCULAR | Status: AC
  Administered 2015-03-23: 10:00:00
  Filled 2015-03-23: qty 5

## 2015-03-23 MED ORDER — OXYCODONE HCL 5 MG PO CAPS: 5.0000 mg | ORAL_CAPSULE | Freq: Every day | ORAL | Status: DC

## 2015-03-23 MED ORDER — DULOXETINE HCL 30 MG PO CPEP: ORAL_CAPSULE | ORAL | Status: DC

## 2015-03-23 MED ORDER — MIDAZOLAM HCL 5 MG/5ML IJ SOLN
INTRAMUSCULAR | Status: AC
  Administered 2015-03-23: 5 mg
  Filled 2015-03-23: qty 5

## 2015-03-23 MED ORDER — CEFAZOLIN SODIUM 1 G IJ SOLR
INTRAMUSCULAR | Status: AC
  Administered 2015-03-23: 1 g
  Filled 2015-03-23: qty 10

## 2015-03-23 MED ORDER — SODIUM CHLORIDE 0.9 % IJ SOLN
INTRAMUSCULAR | Status: AC
  Administered 2015-03-23: 10:00:00
  Filled 2015-03-23: qty 20

## 2015-03-23 MED ORDER — DICLOFENAC EPOLAMINE 1.3 % TD PTCH: 1.0000 | MEDICATED_PATCH | Freq: Two times a day (BID) | TRANSDERMAL | Status: DC

## 2015-03-23 NOTE — Progress Notes (Signed)
   Subjective:    Patient ID: Theresa Cobb, female    DOB: 21-Jul-1966, 49 y.o.   MRN: [id number]  HPI    Review of Systems     Objective:   Physical Exam        Assessment & Plan:

## 2015-03-23 NOTE — Progress Notes (Signed)
   Subjective:    Patient ID: Theresa Cobb, female    DOB: 1966/03/26, 49 y.o.   MRN: [id number]  HPI    Review of Systems     Objective:   Physical Exam        Assessment & Plan:

## 2015-03-23 NOTE — Progress Notes (Signed)
   Subjective:    Patient ID: Theresa Cobb, female    DOB: Feb 29, 1966, 49 y.o.   MRN: [id number]  HPI    Review of Systems     Objective:   Physical Exam        Assessment & Plan:

## 2015-03-23 NOTE — Patient Instructions (Addendum)
Continue present medications and antibioticcs  F/U PCP for evaliation of  BP and general medical  Condition.  F/U surgical evaluation.  F/U nrurological evaluation.  May consider radiofrequency rhizolysis or intraspinal procedures pending response to present treatment and F/U evaluation.  Patient to call [facility] should patient have concerns prior to scheduled return appointment.    Pain Management Discharge Instructions  General Discharge Instructions :  If you need to reach your doctor call: Monday-Friday 8:00 am - 4:00 pm at [phone] or toll free [phone].  After clinic hours [phone] to have operator reach doctor.  Bring all of your medication bottles to all your appointments in the pain clinic.  To cancel or reschedule your appointment with Pain Management please remember to call 24 hours in advance to avoid a fee.  Refer to the educational materials which you have been given on: General Risks, I had my Procedure. Discharge Instructions, Post Sedation.  Post Procedure Instructions:  The drugs you were given will stay in your system until tomorrow, so for the next 24 hours you should not drive, make any legal decisions or drink any alcoholic beverages.  You may eat anything you prefer, but it is better to start with liquids then soups and crackers, and gradually work up to solid foods.  Please notify your doctor immediately if you have any unusual bleeding, trouble breathing or pain that is not related to your normal pain.  Depending on the type of procedure that was done, some parts of your body may feel week and/or numb.  This usually clears up by tonight or the next day.  Walk with the use of an assistive device or accompanied by an adult for the 24 hours.  You may use ice on the affected area for the first 24 hours.  Put ice in a Ziploc bag and cover with a towel and place against area 15 minutes on 15 minutes off.  You may switch to heat after  24 hours.

## 2015-03-23 NOTE — Progress Notes (Signed)
MRI PROCEDURE PERFORMED: Lumbar epidural steroid injection   NOTE: The patient is a 49 y.o. female who returns to [facility] for further evaluation and treatment of pain involving the lumbar and lower extremity region. MRI revealed the patient to be with degenerative changes of the lumbar spine L4-5 annular disc bulging right paracentral disc protrusion spinal stenosis flattening of the lateral recesses facet hypertrophy and hypertrophy of the ligamentum flavum with L5-S1 annular disc bulging and narrowing of the neural foramen. The risks, benefits, and expectations of the procedure have been discussed and explained to the patient who was understanding and in agreement with suggested treatment plan. We will proceed with interventional treatment as discussed and explained to the patient who is willing to proceed with procedure as planned.   DESCRIPTION OF PROCEDURE: Lumbar epidural steroid injection with IV Versed, IV fentanyl conscious sedation, EKG, blood pressure, pulse, and pulse oximetry monitoring. The procedure was performed with the patient in the prone position under fluoroscopic guidance. A local anesthetic skin wheal of 1.5% plain lidocaine was accomplished at proposed entry site. An 18-gauge Tuohy epidural needle was inserted at the L 5 vertebral body level right of the midline via loss-of-resistance technique with negative heme and negative CSF return. A total of 4 mL of Preservative-Free normal saline with 40 mg of Kenalog injected incrementally via epidurally placed needle. Needle removed. The patient tolerated the injection well.   PLAN:   1. Medications: We will continue presently prescribed medications. 2. Will consider modification of treatment regimen pending response to treatment rendered on today's visit and follow-up evaluation. 3. The patient is to follow-up with primary care physician regarding blood pressure and general medical condition status post lumbar epidural  steroid injection performed on today's visit. 4. Surgical evaluation. 5. Neurological evaluation. 6. The patient may be a candidate for radiofrequency procedures, implantation device, and other treatment pending response to treatment and follow-up evaluation. 7. The patient has been advised to adhere to proper body mechanics and avoid activities which appear to aggravate condition. 8. The patient has been advised to call the [facility] Management Center prior to scheduled return appointment should there be significant change in condition or should there be significant  1. Medications: We will continue presently prescribed medications.  2. Will consider modification of treatment regimen pending response to treatment rendered on today's visit and follow-up evaluation.  3. The patient is to follow-up with primary care physician regarding blood pressure and general medical condition status post lumbar epidural steroid injection performed on today's visit.  4. Surgical evaluation.  5. Neurological evaluation. 6. The patient may be a candidate for radiofrequency procedures, implantation device, and other treatment pending response to treatment and follow-up evaluation.  7. The patient has been advised to adhere to proper body mechanics and avoid activities which appear to aggravate condition.  8. The patient has been advised to call the [facility] Management Center prior to scheduled return appointment should there be significant change in condition or should should patient have other concerns regarding condition prior to scheduled return appointment.  The patient is understanding and in agreement with suggested treatment plan.

## 2015-03-24 ENCOUNTER — Telehealth: Payer: Self-pay | Admitting: *Deleted

## 2015-03-24 MED ORDER — CEFUROXIME AXETIL 250 MG PO TABS: 250.0000 mg | ORAL_TABLET | Freq: Two times a day (BID) | ORAL | Status: DC

## 2015-03-24 NOTE — Addendum Note (Signed)
Addended by: Dewayne Shorter on: 03/24/2015 12:59 PM   Modules accepted: Orders

## 2015-03-24 NOTE — Telephone Encounter (Signed)
Callback done

## 2015-03-24 NOTE — Telephone Encounter (Signed)
Patient called and informed by verbal message that script was sent to pharmacy.

## 2015-03-30 ENCOUNTER — Telehealth: Payer: Self-pay | Admitting: *Deleted

## 2015-03-30 NOTE — Telephone Encounter (Signed)
Prior authorization completed online via CoverMyMeds for Lidocaine patches ( DMA)

## 2015-04-06 NOTE — Telephone Encounter (Signed)
[employer] [employer] (Medicaid) call for prior authorization for Lidocaine patches. Prior authorization [id number] [facility] notified of the prior authorization request and will check with her pharmacy tomorrow to see if it is covered under her insurance.

## 2015-04-12 ENCOUNTER — Other Ambulatory Visit: Payer: Self-pay | Admitting: Pain Medicine

## 2015-04-17 ENCOUNTER — Other Ambulatory Visit: Payer: Self-pay | Admitting: Pain Medicine

## 2015-04-17 DIAGNOSIS — M159 Polyosteoarthritis, unspecified: Secondary | ICD-10-CM

## 2015-04-17 DIAGNOSIS — M5137 Other intervertebral disc degeneration, lumbosacral region: Secondary | ICD-10-CM

## 2015-04-17 DIAGNOSIS — M5481 Occipital neuralgia: Secondary | ICD-10-CM

## 2015-04-17 DIAGNOSIS — M150 Primary generalized (osteo)arthritis: Secondary | ICD-10-CM

## 2015-04-17 DIAGNOSIS — M533 Sacrococcygeal disorders, not elsewhere classified: Secondary | ICD-10-CM

## 2015-04-19 ENCOUNTER — Ambulatory Visit: Payer: Medicaid Other | Attending: Pain Medicine | Admitting: Pain Medicine

## 2015-04-19 ENCOUNTER — Encounter: Payer: Self-pay | Admitting: Pain Medicine

## 2015-04-19 VITALS — BP 104/69 | HR 92 | Temp 98.3°F | Resp 16 | Ht 66.0 in | Wt 164.0 lb

## 2015-04-19 DIAGNOSIS — M533 Sacrococcygeal disorders, not elsewhere classified: Secondary | ICD-10-CM | POA: Diagnosis not present

## 2015-04-19 DIAGNOSIS — M5136 Other intervertebral disc degeneration, lumbar region: Secondary | ICD-10-CM | POA: Insufficient documentation

## 2015-04-19 DIAGNOSIS — M545 Low back pain: Secondary | ICD-10-CM | POA: Diagnosis present

## 2015-04-19 DIAGNOSIS — M461 Sacroiliitis, not elsewhere classified: Secondary | ICD-10-CM | POA: Diagnosis not present

## 2015-04-19 DIAGNOSIS — M5481 Occipital neuralgia: Secondary | ICD-10-CM

## 2015-04-19 DIAGNOSIS — M5137 Other intervertebral disc degeneration, lumbosacral region: Secondary | ICD-10-CM

## 2015-04-19 DIAGNOSIS — M5416 Radiculopathy, lumbar region: Secondary | ICD-10-CM | POA: Diagnosis not present

## 2015-04-19 DIAGNOSIS — M51379 Other intervertebral disc degeneration, lumbosacral region without mention of lumbar back pain or lower extremity pain: Secondary | ICD-10-CM

## 2015-04-19 DIAGNOSIS — M7060 Trochanteric bursitis, unspecified hip: Secondary | ICD-10-CM | POA: Diagnosis not present

## 2015-04-19 DIAGNOSIS — Z9889 Other specified postprocedural states: Secondary | ICD-10-CM | POA: Insufficient documentation

## 2015-04-19 DIAGNOSIS — M159 Polyosteoarthritis, unspecified: Secondary | ICD-10-CM

## 2015-04-19 MED ORDER — CYCLOBENZAPRINE HCL 10 MG PO TABS: 10.0000 mg | ORAL_TABLET | Freq: Two times a day (BID) | ORAL | Status: DC | PRN

## 2015-04-19 MED ORDER — LIDOCAINE 5 % EX PTCH: MEDICATED_PATCH | CUTANEOUS | Status: DC

## 2015-04-19 MED ORDER — OXYCODONE HCL 5 MG PO CAPS: ORAL_CAPSULE | ORAL | Status: DC

## 2015-04-19 MED ORDER — GABAPENTIN 400 MG PO CAPS: ORAL_CAPSULE | ORAL | Status: DC

## 2015-04-19 MED ORDER — DICLOFENAC EPOLAMINE 1.3 % TD PTCH: MEDICATED_PATCH | TRANSDERMAL | Status: DC

## 2015-04-19 MED ORDER — TIZANIDINE HCL 2 MG PO TABS: ORAL_TABLET | ORAL | Status: DC

## 2015-04-19 MED ORDER — DULOXETINE HCL 30 MG PO CPEP: ORAL_CAPSULE | ORAL | Status: DC

## 2015-04-19 MED ORDER — DICLOFENAC SODIUM 1 % TD GEL: TRANSDERMAL | Status: DC

## 2015-04-19 NOTE — Progress Notes (Signed)
Safety precautions to be maintained throughout the outpatient stay will include: orient to surroundings, keep bed in low position, maintain call bell within reach at all times, provide assistance with transfer out of bed and ambulation.  

## 2015-04-19 NOTE — Progress Notes (Signed)
   Subjective:    Patient ID: Theresa Cobb, female    DOB: 07-09-1966, 49 y.o.   MRN: [id number]  HPI  Patient is 49 year old female who [facility] and treatment of pain involving the lower back lower extremity region. Patient states she has increased spasms of the lower extremities she has had improvement with prior interventional treatment and [facility] and wishes to undergo interventional treatment time return appointment discussed. Patient denies trauma change in events of daily living the call significant change since cause except she states that she does spend 12 hours on her feet on the job which appears to be aggravating her symptoms to some degree. We discussed patient's condition will proceed with lumbosacral selective nerve root block at time return appointment and patient will undergo follow-up evaluation with Dr. Rebeca Alert for evaluation of her lower extremity cramping as discussed and explained to patient today's visit patient was understanding and agrees to treatment plan.        Review of Systems     Objective:   Physical Exam  There was mild tenderness of the splenius capitis and occipitalis regions. Palpation of the acromioclavicular glenohumeral joint region reproduced minimal discomfort. There was unremarkable Spurling's maneuver. Tinel and Phalen's maneuver were without increase of pain with sitting of any significant degree. There was minimal tenderness over the cervical thoracic paraspinal muscles region cervical and thoracic facet regions. Tinel crepitus of the thoracic region was noted. Palpation over the lumbar paraspinal muscles lumbar facet region associated with tinged palpation of moderate degree with lateral bending and rotation extension and palpation of the lumbar facets reproducing moderate to moderately severe discomfort on the left as well as on the right. There was moderate tenderness of the PSIS  PSIS region gluteal and piriformis musculature region. Straight leg raising tolerate approximately 20 without a definite increased pain dorsiflexion noted. There was question decreased sensation along the L5 dermatomal distribution detected. There was negative clonus negative Homans. DTRs difficult to elicit patient with difficulty relaxing. Negative clonus negative Homans. Abdomen was nontender with no costovertebral angle tenderness noted.    Assessment & Plan:  Degenerative disc disease lumbar spine  Status post lumbar laminectomy  Lumbar radiculopathy  Lumbar facet syndrome  Sacroiliitis, sacroiliac joint dysfunction  Greater trochanteric bursitis    Plan    Continue present medications.  Lumbosacral selective nerve root block to be performed at time return appointment  F/U PCP for evaliation of  BP and general medical  condition. Follow-up with Dr. Rebeca Alert to discuss spasms of the lower extremities. Patient will follow-up Dr. Rebeca Alert to consider laboratory studies consisting of potassium and magnesium calcium levels and other studies to further evaluate lower extremity spasms  F/U surgical evaluation.  F/U neurological evaluation.  May consider radiofrequency rhizolysis or intraspinal procedures pending response to present treatment and F/U evaluation.  Patient to call [facility] Management Center should patient have concerns prior to scheduled return appointment.

## 2015-04-19 NOTE — Patient Instructions (Addendum)
Continue present medications.  Lumbosacral selective nerve root block 05/04/2015  F/U PCP for evaliation of  BP and general medical  condition. As discussed and as Dr. Rebeca Alert to consider checking your potassium calcium magnesium and other lab values which may be related to your cramping of the lower extremities   F/U surgical evaluation.  F/U neurological evaluation.  May consider radiofrequency rhizolysis or intraspinal procedures pending response to present treatment and F/U evaluation.  Patient to call [facility] should patient have concerns prior to scheduled return appointment. Selective Nerve Root Block Patient Information  Description: Specific nerve roots exit the spinal canal and these nerves can be compressed and inflamed by a bulging disc and bone spurs.  By injecting steroids on the nerve root, we can potentially decrease the inflammation surrounding these nerves, which often leads to decreased pain.  Also, by injecting local anesthesia on the nerve root, this can provide [place] helpful information to give to your referring doctor if it decreases your pain.  Selective nerve root blocks can be done along the spine from the neck to the low back depending on the location of your pain.   After numbing the skin with local anesthesia, a small needle is passed to the nerve root and the position of the needle is verified using x-ray pictures.  After the needle is in correct position, we then deposit the medication.  You may experience a pressure sensation while this is being done.  The entire block usually lasts less than 15 minutes.  Conditions that may be treated with selective nerve root blocks:  Low back and leg pain  Spinal stenosis  Diagnostic block prior to potential surgery  Neck and arm pain  Post laminectomy syndrome  Preparation for the injection:  1. Do not eat any solid food or dairy products within 6 hours of your appointment. 2. You may drink clear liquids  up to 2 hours before an appointment.  Clear liquids include water, black coffee, juice or soda.  No milk or cream please. 3. You may take your regular medications, including pain medications, with a sip of water before your appointment.  Diabetics should hold regular insulin (if taken separately) and take 1/2 normal NPH dose the morning of the procedure.  Carry some sugar containing items with you to your appointment. 4. A driver must accompany you and be prepared to drive you home after your procedure. 5. Bring all your current medications with you. 6. An IV may be inserted and sedation may be given at the discretion of the physician. 7. A blood pressure cuff, EKG, and other monitors will often be applied during the procedure.  Some patients may need to have extra oxygen administered for a short period. 8. You will be asked to provide medical information, including allergies, prior to the procedure.  We must know immediately if you are taking blood  Thinners (like Coumadin) or if you are allergic to IV iodine contrast (dye).  Possible side-effects: All are usually temporary  Bleeding from needle site  Light headedness  Numbness and tingling  Decreased blood pressure  Weakness in arms/legs  Pressure sensation in back/neck  Pain at injection site (several days)  Possible complications: All are extremely rare  Infection  Nerve injury  Spinal headache (a headache wore with upright position)  Call if you experience:  Fever/chills associated with headache or increased back/neck pain  Headache worsened by an upright position  New onset weakness or numbness of an extremity below the  injection site  Hives or difficulty breathing (go to the emergency room)  Inflammation or drainage at the injection site(s)  Severe back/neck pain greater than usual  New symptoms which are concerning to you  Please note:  Although the local anesthetic injected can often make your back or  neck feel good for several hours after the injection the pain will likely return.  It takes 3-5 days for steroids to work on the nerve root. You may not notice any pain relief for at least one week.  If effective, we will often do a series of 3 injections spaced 3-6 weeks apart to maximally decrease your pain.    If you have any questions, please call [phone] [facility]

## 2015-04-19 NOTE — Patient Instructions (Signed)
Continue present medications.  F/U PCP for evaliation of  BP and general medical  condition.  F/U surgical evaluation.  F/U neurological evaluation.  May consider radiofrequency rhizolysis or intraspinal procedures pending response to present treatment and F/U evaluation.  Patient to call [facility] should patient have concerns prior to scheduled return appointment.  

## 2015-04-21 ENCOUNTER — Ambulatory Visit: Payer: Medicaid Other | Admitting: Pain Medicine

## 2015-04-26 ENCOUNTER — Ambulatory Visit
Admission: EM | Admit: 2015-04-26 | Discharge: 2015-04-26 | Disposition: A | Discharge status: Home / Self Care | Payer: Medicaid Other | Attending: Family Medicine | Admitting: Family Medicine

## 2015-04-26 ENCOUNTER — Encounter: Payer: Self-pay | Admitting: Emergency Medicine

## 2015-04-26 DIAGNOSIS — Z79899 Other long term (current) drug therapy: Secondary | ICD-10-CM | POA: Diagnosis not present

## 2015-04-26 DIAGNOSIS — J020 Streptococcal pharyngitis: Secondary | ICD-10-CM

## 2015-04-26 DIAGNOSIS — Z803 Family history of malignant neoplasm of breast: Secondary | ICD-10-CM | POA: Insufficient documentation

## 2015-04-26 DIAGNOSIS — N760 Acute vaginitis: Secondary | ICD-10-CM | POA: Diagnosis not present

## 2015-04-26 DIAGNOSIS — J3089 Other allergic rhinitis: Secondary | ICD-10-CM

## 2015-04-26 DIAGNOSIS — A499 Bacterial infection, unspecified: Secondary | ICD-10-CM

## 2015-04-26 DIAGNOSIS — H6983 Other specified disorders of Eustachian tube, bilateral: Secondary | ICD-10-CM | POA: Insufficient documentation

## 2015-04-26 DIAGNOSIS — H6993 Unspecified Eustachian tube disorder, bilateral: Secondary | ICD-10-CM

## 2015-04-26 DIAGNOSIS — B9689 Other specified bacterial agents as the cause of diseases classified elsewhere: Secondary | ICD-10-CM

## 2015-04-26 DIAGNOSIS — H9209 Otalgia, unspecified ear: Secondary | ICD-10-CM | POA: Diagnosis present

## 2015-04-26 LAB — WET PREP, GENITAL
Trich, Wet Prep: NONE SEEN
YEAST WET PREP: NONE SEEN

## 2015-04-26 LAB — RAPID STREP SCREEN (MED CTR MEBANE ONLY): STREPTOCOCCUS, GROUP A SCREEN (DIRECT): POSITIVE — AB

## 2015-04-26 MED ORDER — LORATADINE 10 MG PO TABS: 10.0000 mg | ORAL_TABLET | Freq: Every day | ORAL | Status: DC

## 2015-04-26 MED ORDER — METRONIDAZOLE 0.75 % VA GEL: 1.0000 | Freq: Every day | VAGINAL | Status: DC

## 2015-04-26 MED ORDER — IBUPROFEN 800 MG PO TABS
800.0000 mg | ORAL_TABLET | Freq: Once | ORAL | Status: AC
  Administered 2015-04-26: 800 mg via ORAL

## 2015-04-26 MED ORDER — FLUCONAZOLE 150 MG PO TABS: ORAL_TABLET | ORAL | Status: DC

## 2015-04-26 MED ORDER — PENICILLIN V POTASSIUM 500 MG PO TABS: ORAL_TABLET | ORAL | Status: DC

## 2015-04-26 MED ORDER — FLUTICASONE PROPIONATE 50 MCG/ACT NA SUSP: 2.0000 | Freq: Every day | NASAL | Status: DC

## 2015-04-26 NOTE — ED Provider Notes (Signed)
CSN: [id number]     Arrival date & time 04/26/15  1512 History   First MD Initiated Contact with Patient 04/26/15 1523     Chief Complaint  Patient presents with  . Otalgia   (Consider location/radiation/quality/duration/timing/severity/associated sxs/prior Treatment) HPI  49 yo F reports 4 day hx of sore throat , headache, malaise- no fever. Has nasal congestion,post nasal drip and itchy eyes. Hx of seasonal allergy diagnosis but stopped taking Rx. Recent increased vaginal discharge- not sexually active- labia irritated  Multiple back issues- pain clinic-back injections every 6 weeks Past Medical History  Diagnosis Date  . 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 Back problem   . Family history of malignant neoplasm of breast     mother had breast cancer  . Palpitations    Past Surgical History  Procedure Laterality Date  . Lumbar epidural injection  2012    injection in the back  . Breast mass excision Right 2012  . Back surgery     Family History  Problem Relation Age of Onset  . Breast cancer Mother    History  Substance Use Topics  . Smoking status: Never Smoker   . Smokeless tobacco: Not on file  . Alcohol Use: Not on file   OB History    Gravida Para Term Preterm AB TAB SAB Ectopic Multiple Living   2 2        2       Obstetric Comments   First pregnancy 26 First menstrual 15     Review of Systems Review of 10 systems negative for acute change except as referenced in HPI   Allergies  No known allergies  Home Medications   Prior to Admission medications   Medication Sig Start Date End Date Taking? Authorizing Provider  cefUROXime (CEFTIN) 250 MG tablet Take 1 tablet (250 mg total) by mouth 2 (two) times daily with a meal. Patient not taking: Reported on 04/19/2015 03/24/15   Mohammed Kindle, MD  cyclobenzaprine (FLEXERIL) 10 MG tablet Take 1 tablet (10 mg total) by mouth 2 (two) times daily as needed for muscle spasms. 04/19/15   Mohammed Kindle, MD   diclofenac (FLECTOR) 1.3 % PTCH Apply 1 patch to skin twice a day if tolerated 04/19/15   Mohammed Kindle, MD  diclofenac sodium (VOLTAREN) 1 % GEL Apply 2-4 grams to painful areas 4 times per day if tolerated Patient not taking: Reported on 04/19/2015 04/19/15   Mohammed Kindle, MD  DULoxetine (CYMBALTA) 30 MG capsule 1-2 tablets by mouth daily if tolerated 04/19/15   Mohammed Kindle, MD  fluconazole (DIFLUCAN) 150 MG tablet Take one tablet if vaginal itching develops after antibiotics- repeat in one week if needed 04/26/15   Jan Fireman, PA-C  fluticasone [facility]) 50 MCG/ACT nasal spray Place 2 sprays into both nostrils daily. 04/26/15   Jan Fireman, PA-C  gabapentin (NEURONTIN) 400 MG capsule Limit 1 to 2 tabs twice a day to 4 times a day if tolerated 04/19/15   Mohammed Kindle, MD  HYDROcodone-acetaminophen (NORCO/VICODIN) 5-325 MG per tablet Take 1 tablet by mouth every 4 (four) hours as needed. Patient not taking: Reported on 03/23/2015 12/28/13   Tatyana Kirichenko, PA-C  ibuprofen (ADVIL,MOTRIN) 600 MG tablet Take 1 tablet (600 mg total) by mouth every 6 (six) hours as needed. 12/28/13   Tatyana Kirichenko, PA-C  lidocaine (LIDODERM) 5 % Limit applying 1-3 patches to painful areas of skin for 12 hours then remove for 12 hours  and repeat process if tolerated. 04/19/15   Mohammed Kindle, MD  loratadine (CLARITIN) 10 MG tablet Take 1 tablet (10 mg total) by mouth daily. 04/26/15   Jan Fireman, PA-C  medroxyPROGESTERone (DEPO-PROVERA) 150 MG/ML injection Inject 150 mg into the muscle every 3 (three) months.    Historical Provider, MD  metroNIDAZOLE (METROGEL VAGINAL) 0.75 % vaginal gel Place 1 Applicatorful vaginally at bedtime. 04/26/15   Jan Fireman, PA-C  oxycodone (OXY-IR) 5 MG capsule Limit 1 tab by mouth 3-5 times per day if tolerated 04/19/15   Mohammed Kindle, MD  OxyCODONE HCl, Abuse Deter, 5 MG TABA Take 5 mg by mouth. Limit 1 tablet 2-5 times daily if tolerated.    Historical Provider, MD  penicillin v  potassium (VEETID) 500 MG tablet 1 tablet twice daily for 10 days 04/26/15   Jan Fireman, PA-C  tiZANidine (ZANAFLEX) 2 MG tablet 1-2 tablets by mouth bid to tid if tolerated 04/19/15   Mohammed Kindle, MD   BP 120/74 mmHg  Pulse 100  Temp(Src) 97.9 F (36.6 C) (Tympanic)  Ht 5\' 6"  (1.676 m)  Wt 165 lb (74.844 kg)  BMI 26.64 kg/m2  SpO2 100% Physical Exam  Constitutional -alert and oriented,nasal congestion, post nasal drip, Head-atraumatic Eyes- conjunctiva normal, EOMI ,conjugate gaze Nose- mild congestion, clear rhinorrhea Mouth/throat- mucous membranes moist ,oropharynx erythematous-strep screen-POSITIVE Neck- supple, tender anterior lymphadenopathy  Bilat; neg posterior CV- 100 rate, grossly normal heart sounds,  Resp-no distress, normal respiratory effort,clear to auscultation bilaterally GI- soft,non-tender,no distention GU- EGBUS- WNL, no evidence of lesion, marital introitus, minimal discharge,wet prep submitted; cervix is clean; bimanual deferred MSK- no lower extremity tenderness nor edema,no joint effusion, ambulatory Neuro- normal speech and language, no gross focal neurological deficit appreciated,  Skin-warm,dry ,intact; no rash noted Psych-mood and affect grossly normal;   ED Course  Procedures (including critical care time) Labs Review Labs Reviewed  RAPID STREP SCREEN (NOT AT [facility]) - Abnormal; Notable for the following:    Streptococcus, Group A Screen (Direct) POSITIVE (*)    All other components within normal limits  WET PREP, GENITAL - Abnormal; Notable for the following:    Clue Cells Wet Prep HPF POC FEW (*)    WBC, Wet Prep HPF POC MODERATE (*)    All other components within normal limits   Results for orders placed or performed during the hospital encounter of 04/26/15  Rapid strep screen  Result Value Ref Range   Streptococcus, Group A Screen (Direct) POSITIVE (A) NEGATIVE  Wet prep, genital  Result Value Ref Range   Yeast Wet Prep HPF POC NONE SEEN  NONE SEEN   Trich, Wet Prep NONE SEEN NONE SEEN   Clue Cells Wet Prep HPF POC FEW (A) NONE SEEN   WBC, Wet Prep HPF POC MODERATE (A) NONE SEEN   Imaging Review No results found.   MDM   1. Eustachian tube dysfunction, bilateral   2. Environmental and seasonal allergies   3. Strep pharyngitis   4. Bacterial vaginosis    Discharge Medication List as of 04/26/2015  4:52 PM    START taking these medications   Details  fluticasone (FLONASE) 50 MCG/ACT nasal spray Place 2 sprays into both nostrils daily., Starting 04/26/2015, Until Discontinued, Normal    loratadine (CLARITIN) 10 MG tablet Take 1 tablet (10 mg total) by mouth daily., Starting 04/26/2015, Until Discontinued, Normal      Metronidazole vaginal cream one applicatorful at bedtime for 7 days  PEN VK 500mg   1 by mouth twice daily x 10 days. Patient offered  IM Rx but deferred.  Guafenesin DM 1 tsp every 4 hours for eustachian tube dysfunction  Increase PO liquids, steam, vaporizer....gentle valsalva.  Informational handouts on all given to patient and reviewed with her  Diagnoses and treatments discussed. . Questions fielded, expectations and recommendations reviewed. Patient expresses understanding. Will return to [facility] with questions, concern or exacerbation.    Jan Fireman, PA-C 04/27/15 2306

## 2015-04-26 NOTE — ED Notes (Signed)
Patient states she has ear ache and sore throat, cough, and a possible yeast infection

## 2015-04-26 NOTE — Discharge Instructions (Signed)
Allergic Rhinitis Allergic rhinitis is when the mucous membranes in the nose respond to allergens. Allergens are particles in the air that cause your body to have an allergic reaction. This causes you to release allergic antibodies. Through a chain of events, these eventually cause you to release histamine into the blood stream. Although meant to protect the body, it is this release of histamine that causes your discomfort, such as frequent sneezing, congestion, and an itchy, runny nose.  CAUSES  Seasonal allergic rhinitis (hay fever) is caused by pollen allergens that may come from grasses, trees, and weeds. Year-round allergic rhinitis (perennial allergic rhinitis) is caused by allergens such as house dust mites, pet dander, and mold spores.  SYMPTOMS   Nasal stuffiness (congestion).  Itchy, runny nose with sneezing and tearing of the eyes. DIAGNOSIS  Your health care provider can help you determine the allergen or allergens that trigger your symptoms. If you and your health care provider are unable to determine the allergen, skin or blood testing may be used. TREATMENT  Allergic rhinitis does not have a cure, but it can be controlled by:  Medicines and allergy shots (immunotherapy).  Avoiding the allergen. Hay fever may often be treated with antihistamines in pill or nasal spray forms. Antihistamines block the effects of histamine. There are over-the-counter medicines that may help with nasal congestion and swelling around the eyes. Check with your health care provider before taking or giving this medicine.  If avoiding the allergen or the medicine prescribed do not work, there are many new medicines your health care provider can prescribe. Stronger medicine may be used if initial measures are ineffective. Desensitizing injections can be used if medicine and avoidance does not work. Desensitization is when a patient is given ongoing shots until the body becomes less sensitive to the allergen.  Make sure you follow up with your health care provider if problems continue. HOME CARE INSTRUCTIONS It is not possible to completely avoid allergens, but you can reduce your symptoms by taking steps to limit your exposure to them. It helps to know exactly what you are allergic to so that you can avoid your specific triggers. SEEK MEDICAL CARE IF:   You have a fever.  You develop a cough that does not stop easily (persistent).  You have shortness of breath.  You start wheezing.  Symptoms interfere with normal daily activities. Document Released: 07/24/2001 Document Revised: 11/03/2013 Document Reviewed: 07/06/2013 [facility] Patient Information 2015 [place], [place]. This information is not intended to replace advice given to you by your health care provider. Make sure you discuss any questions you have with your health care provider. Barotitis Media Barotitis media is inflammation of your middle ear. This occurs when the auditory tube (eustachian tube) leading from the back of your nose (nasopharynx) to your eardrum is blocked. This blockage may result from a cold, environmental allergies, or an upper respiratory infection. Unresolved barotitis media may lead to damage or hearing loss (barotrauma), which may become permanent. HOME CARE INSTRUCTIONS   Use medicines as recommended by your health care provider. Over-the-counter medicines will help unblock the canal and can help during times of air travel.  Do not put anything into your ears to clean or unplug them. Eardrops will not be helpful.  Do not swim, dive, or fly until your health care provider says it is all right to do so. If these activities are necessary, chewing gum with frequent, forceful swallowing may help. It is also helpful to hold your nose and  gently blow to pop your ears for equalizing pressure changes. This forces air into the eustachian tube.  Only take over-the-counter or prescription medicines for pain, discomfort, or  fever as directed by your health care provider.  A decongestant may be helpful in decongesting the middle ear and make pressure equalization easier. SEEK MEDICAL CARE IF:  You experience a serious form of dizziness in which you feel as if the room is spinning and you feel nauseated (vertigo).  Your symptoms only involve one ear. SEEK IMMEDIATE MEDICAL CARE IF:   You develop a severe headache, dizziness, or severe ear pain.  You have bloody or pus-like drainage from your ears.  You develop a fever.  Your problems do not improve or become worse. MAKE SURE YOU:   Understand these instructions.  Will watch your condition.  Will get help right away if you are not doing well or get worse. Document Released: 10/26/2000 Document Revised: 08/19/2013 Document Reviewed: 05/26/2013 [facility] Patient Information 2015 [place], [place]. This information is not intended to replace advice given to you by your health care provider. Make sure you discuss any questions you have with your health care provider. Bacterial Vaginosis Bacterial vaginosis is a vaginal infection that occurs when the normal balance of bacteria in the vagina is disrupted. It results from an overgrowth of certain bacteria. This is the most common vaginal infection in women of childbearing age. Treatment is important to prevent complications, especially in pregnant women, as it can cause a premature delivery. CAUSES  Bacterial vaginosis is caused by an increase in harmful bacteria that are normally present in smaller amounts in the vagina. Several different kinds of bacteria can cause bacterial vaginosis. However, the reason that the condition develops is not fully understood. RISK FACTORS Certain activities or behaviors can put you at an increased risk of developing bacterial vaginosis, including:  Having a new sex partner or multiple sex partners.  Douching.  Using an intrauterine device (IUD) for contraception. Women do not  get bacterial vaginosis from toilet seats, bedding, swimming pools, or contact with objects around them. SIGNS AND SYMPTOMS  Some women with bacterial vaginosis have no signs or symptoms. Common symptoms include:  Grey vaginal discharge.  A fishlike odor with discharge, especially after sexual intercourse.  Itching or burning of the vagina and vulva.  Burning or pain with urination. DIAGNOSIS  Your health care provider will take a medical history and examine the vagina for signs of bacterial vaginosis. A sample of vaginal fluid may be taken. Your health care provider will look at this sample under a microscope to check for bacteria and abnormal cells. A vaginal pH test may also be done.  TREATMENT  Bacterial vaginosis may be treated with antibiotic medicines. These may be given in the form of a pill or a vaginal cream. A second round of antibiotics may be prescribed if the condition comes back after treatment.  HOME CARE INSTRUCTIONS   Only take over-the-counter or prescription medicines as directed by your health care provider.  If antibiotic medicine was prescribed, take it as directed. Make sure you finish it even if you start to feel better.  Do not have sex until treatment is completed.  Tell all sexual partners that you have a vaginal infection. They should see their health care provider and be treated if they have problems, such as a mild rash or itching.  Practice safe sex by using condoms and only having one sex partner. SEEK MEDICAL CARE IF:   Your  symptoms are not improving after 3 days of treatment.  You have increased discharge or pain.  You have a fever. MAKE SURE YOU:   Understand these instructions.  Will watch your condition.  Will get help right away if you are not doing well or get worse. FOR MORE INFORMATION  Centers for Disease Control and Prevention, Division of STD Prevention: [employer] American Sexual Health Association (ASHA): www.ashastd.org    Document Released: 10/29/2005 Document Revised: 08/19/2013 Document Reviewed: 06/10/2013 [facility] Patient Information 2015 [place], [place]. This information is not intended to replace advice given to you by your health care provider. Make sure you discuss any questions you have with your health care provider.

## 2015-04-27 ENCOUNTER — Encounter: Payer: Self-pay | Admitting: Physician Assistant

## 2015-05-04 ENCOUNTER — Ambulatory Visit: Payer: Medicaid Other | Admitting: Pain Medicine

## 2015-05-09 ENCOUNTER — Telehealth: Payer: Self-pay | Admitting: Pain Medicine

## 2015-05-09 NOTE — Telephone Encounter (Signed)
Pt wants to know if she can get medications same day as procedure? She is trying not to miss so much work.

## 2015-05-09 NOTE — Telephone Encounter (Signed)
I called patient to confirm appt and let her know she could get med refill / 05-09-15

## 2015-05-09 NOTE — Telephone Encounter (Signed)
Meds due 05-22-15 Next appt 05-11-15

## 2015-05-09 NOTE — Telephone Encounter (Signed)
Theresa Cobb, Other nurses, and staff  Yes the  patient may receive prescriptions for medications on the same day of the procedure to minimize time away from work

## 2015-05-11 ENCOUNTER — Encounter: Payer: Self-pay | Admitting: Pain Medicine

## 2015-05-11 ENCOUNTER — Ambulatory Visit: Payer: Medicaid Other | Attending: Pain Medicine | Admitting: Pain Medicine

## 2015-05-11 VITALS — BP 113/78 | HR 77 | Temp 98.1°F | Resp 16 | Ht 66.0 in | Wt 162.0 lb

## 2015-05-11 DIAGNOSIS — M5126 Other intervertebral disc displacement, lumbar region: Secondary | ICD-10-CM | POA: Insufficient documentation

## 2015-05-11 DIAGNOSIS — M159 Polyosteoarthritis, unspecified: Secondary | ICD-10-CM

## 2015-05-11 DIAGNOSIS — M47816 Spondylosis without myelopathy or radiculopathy, lumbar region: Secondary | ICD-10-CM | POA: Diagnosis not present

## 2015-05-11 DIAGNOSIS — M545 Low back pain: Secondary | ICD-10-CM | POA: Diagnosis present

## 2015-05-11 DIAGNOSIS — M533 Sacrococcygeal disorders, not elsewhere classified: Secondary | ICD-10-CM

## 2015-05-11 DIAGNOSIS — M79605 Pain in left leg: Secondary | ICD-10-CM | POA: Diagnosis present

## 2015-05-11 DIAGNOSIS — M4806 Spinal stenosis, lumbar region: Secondary | ICD-10-CM | POA: Insufficient documentation

## 2015-05-11 DIAGNOSIS — M5137 Other intervertebral disc degeneration, lumbosacral region: Secondary | ICD-10-CM

## 2015-05-11 DIAGNOSIS — M5416 Radiculopathy, lumbar region: Secondary | ICD-10-CM

## 2015-05-11 DIAGNOSIS — M5481 Occipital neuralgia: Secondary | ICD-10-CM

## 2015-05-11 DIAGNOSIS — M79604 Pain in right leg: Secondary | ICD-10-CM | POA: Diagnosis present

## 2015-05-11 DIAGNOSIS — M150 Primary generalized (osteo)arthritis: Secondary | ICD-10-CM

## 2015-05-11 MED ORDER — BUPIVACAINE HCL (PF) 0.25 % IJ SOLN
INTRAMUSCULAR | Status: AC
  Administered 2015-05-11: 30 mL
  Filled 2015-05-11: qty 30

## 2015-05-11 MED ORDER — TIZANIDINE HCL 2 MG PO TABS: ORAL_TABLET | ORAL | Status: DC

## 2015-05-11 MED ORDER — ORPHENADRINE CITRATE 30 MG/ML IJ SOLN
INTRAMUSCULAR | Status: AC
  Administered 2015-05-11: 60 mg
  Filled 2015-05-11: qty 2

## 2015-05-11 MED ORDER — CEFUROXIME AXETIL 250 MG PO TABS: 250.0000 mg | ORAL_TABLET | Freq: Two times a day (BID) | ORAL | Status: DC

## 2015-05-11 MED ORDER — FENTANYL CITRATE (PF) 100 MCG/2ML IJ SOLN
INTRAMUSCULAR | Status: AC
Start: 2015-05-11 — End: 2015-05-11
  Administered 2015-05-11: 100 ug via INTRAVENOUS
  Filled 2015-05-11: qty 2

## 2015-05-11 MED ORDER — MIDAZOLAM HCL 5 MG/5ML IJ SOLN
INTRAMUSCULAR | Status: AC
  Administered 2015-05-11: 3 mg via INTRAVENOUS
  Filled 2015-05-11: qty 5

## 2015-05-11 MED ORDER — TRIAMCINOLONE ACETONIDE 40 MG/ML IJ SUSP
INTRAMUSCULAR | Status: AC
  Administered 2015-05-11: 40 mg
  Filled 2015-05-11: qty 1

## 2015-05-11 MED ORDER — CEFAZOLIN SODIUM 1 G IJ SOLR
INTRAMUSCULAR | Status: AC
  Administered 2015-05-11: 1 g via INTRAVENOUS
  Filled 2015-05-11: qty 10

## 2015-05-11 MED ORDER — OXYCODONE HCL 5 MG PO CAPS: ORAL_CAPSULE | ORAL | Status: DC

## 2015-05-11 NOTE — Patient Instructions (Addendum)
Continue present medications. Please obtain Ceftin antibiotic and begin taking Ceftin today  F/U PCP for evaliation of  BP and general medical  condition.  F/U surgical evaluation with Dr. Manson Passey as discussed   F/U neurological evaluation  May consider radiofrequency rhizolysis or intraspinal procedures pending response to present treatment and F/U evaluation.  Patient to call [facility] should patient have concerns prior to scheduled return appointment. GENERAL RISKS AND COMPLICATIONS  What are the risk, side effects and possible complications? Generally speaking, most procedures are safe.  However, with any procedure there are risks, side effects, and the possibility of complications.  The risks and complications are dependent upon the sites that are lesioned, or the type of nerve block to be performed.  The closer the procedure is to the spine, the more serious the risks are.  Great care is taken when placing the radio frequency needles, block needles or lesioning probes, but sometimes complications can occur. 1. Infection: Any time there is an injection through the skin, there is a risk of infection.  This is why sterile conditions are used for these blocks.  There are four possible types of infection. 1. Localized skin infection. 2. Central Nervous System Infection-This can be in the form of Meningitis, which can be deadly. 3. Epidural Infections-This can be in the form of an epidural abscess, which can cause pressure inside of the spine, causing compression of the spinal cord with subsequent paralysis. This would require an emergency surgery to decompress, and there are no guarantees that the patient would recover from the paralysis. 4. Discitis-This is an infection of the intervertebral discs.  It occurs in about 1% of discography procedures.  It is difficult to treat and it may lead to surgery.        2. Pain: the needles have to go through skin and soft tissues, will  cause soreness.       3. Damage to internal structures:  The nerves to be lesioned may be near blood vessels or    other nerves which can be potentially damaged.       4. Bleeding: Bleeding is more common if the patient is taking blood thinners such as  aspirin, Coumadin, Ticiid, Plavix, etc., or if he/she have some genetic predisposition  such as hemophilia. Bleeding into the spinal canal can cause compression of the spinal  cord with subsequent paralysis.  This would require an emergency surgery to  decompress and there are no guarantees that the patient would recover from the  paralysis.       5. Pneumothorax:  Puncturing of a lung is a possibility, every time a needle is introduced in  the area of the chest or upper back.  Pneumothorax refers to free air around the  collapsed lung(s), inside of the thoracic cavity (chest cavity).  Another two possible  complications related to a similar event would include: Hemothorax and Chylothorax.   These are variations of the Pneumothorax, where instead of air around the collapsed  lung(s), you may have blood or chyle, respectively.       6. Spinal headaches: They may occur with any procedures in the area of the spine.       7. Persistent CSF (Cerebro-Spinal Fluid) leakage: This is a rare problem, but may occur  with prolonged intrathecal or epidural catheters either due to the formation of a fistulous  track or a dural tear.       8. Nerve damage: By working so close  to the spinal cord, there is always a possibility of  nerve damage, which could be as serious as a permanent spinal cord injury with  paralysis.       9. Death:  Although rare, severe deadly allergic reactions known as "Anaphylactic  reaction" can occur to any of the medications used.      10. Worsening of the symptoms:  We can always make thing worse.  What are the chances of something like this happening? Chances of any of this occuring are extremely low.  By statistics, you have more of a chance  of getting killed in a motor vehicle accident: while driving to the hospital than any of the above occurring .  Nevertheless, you should be aware that they are possibilities.  In general, it is similar to taking a shower.  Everybody knows that you can slip, hit your head and get killed.  Does that mean that you should not shower again?  Nevertheless always keep in mind that statistics do not mean anything if you happen to be on the wrong side of them.  Even if a procedure has a 1 (one) in a 1,000,000 (million) chance of going wrong, it you happen to be that one..Also, keep in mind that by statistics, you have more of a chance of having something go wrong when taking medications.  Who should not have this procedure? If you are on a blood thinning medication (e.g. Coumadin, Plavix, see list of "Blood Thinners"), or if you have an active infection going on, you should not have the procedure.  If you are taking any blood thinners, please inform your physician.  How should I prepare for this procedure?  Do not eat or drink anything at least six hours prior to the procedure.  Bring a driver with you .  It cannot be a taxi.  Come accompanied by an adult that can drive you back, and that is strong enough to help you if your legs get weak or numb from the local anesthetic.  Take all of your medicines the morning of the procedure with just enough water to swallow them.  If you have diabetes, make sure that you are scheduled to have your procedure done first thing in the morning, whenever possible.  If you have diabetes, take only half of your insulin dose and notify our nurse that you have done so as soon as you arrive at the clinic.  If you are diabetic, but only take blood sugar pills (oral hypoglycemic), then do not take them on the morning of your procedure.  You may take them after you have had the procedure.  Do not take aspirin or any aspirin-containing medications, at least eleven (11) days prior  to the procedure.  They may prolong bleeding.  Wear loose fitting clothing that may be easy to take off and that you would not mind if it got stained with Betadine or blood.  Do not wear any jewelry or perfume  Remove any nail coloring.  It will interfere with some of our monitoring equipment.  NOTE: Remember that this is not meant to be interpreted as a complete list of all possible complications.  Unforeseen problems may occur.  BLOOD THINNERS The following drugs contain aspirin or other products, which can cause increased bleeding during surgery and should not be taken for 2 weeks prior to and 1 week after surgery.  If you should need take something for relief of minor pain, you may take acetaminophen which is  found in Tylenol,m Datril, Anacin-3 and Panadol. It is not blood thinner. The products listed below are.  Do not take any of the products listed below in addition to any listed on your instruction sheet.  A.P.C or A.P.C with Codeine Codeine Phosphate Capsules #3 Ibuprofen Ridaura  ABC compound Congesprin Imuran rimadil  Advil Cope Indocin Robaxisal  Alka-Seltzer Effervescent Pain Reliever and Antacid Coricidin or Coricidin-D  Indomethacin Rufen  Alka-Seltzer plus Cold Medicine Cosprin Ketoprofen S-A-C Tablets  Anacin Analgesic Tablets or Capsules Coumadin Korlgesic Salflex  Anacin Extra Strength Analgesic tablets or capsules CP-2 Tablets Lanoril Salicylate  Anaprox Cuprimine Capsules Levenox Salocol  Anexsia-D Dalteparin Magan Salsalate  Anodynos Darvon compound Magnesium Salicylate Sine-off  Ansaid Dasin Capsules Magsal Sodium Salicylate  Anturane Depen Capsules Marnal Soma  APF Arthritis pain formula Dewitt's Pills Measurin Stanback  Argesic Dia-Gesic Meclofenamic Sulfinpyrazone  Arthritis Bayer Timed Release Aspirin Diclofenac Meclomen Sulindac  Arthritis pain formula Anacin Dicumarol Medipren Supac  Analgesic (Safety coated) Arthralgen Diffunasal Mefanamic Suprofen   Arthritis Strength Bufferin Dihydrocodeine Mepro Compound Suprol  Arthropan liquid Dopirydamole Methcarbomol with Aspirin Synalgos  ASA tablets/Enseals Disalcid Micrainin Tagament  Ascriptin Doan's Midol Talwin  Ascriptin A/D Dolene Mobidin Tanderil  Ascriptin Extra Strength Dolobid Moblgesic Ticlid  Ascriptin with Codeine Doloprin or Doloprin with Codeine Momentum Tolectin  Asperbuf Duoprin Mono-gesic Trendar  Aspergum Duradyne Motrin or Motrin IB Triminicin  Aspirin plain, buffered or enteric coated Durasal Myochrisine Trigesic  Aspirin Suppositories Easprin Nalfon Trillsate  Aspirin with Codeine Ecotrin Regular or Extra Strength Naprosyn Uracel  Atromid-S Efficin Naproxen Ursinus  Auranofin Capsules Elmiron Neocylate Vanquish  Axotal Emagrin Norgesic Verin  Azathioprine Empirin or Empirin with Codeine Normiflo Vitamin E  Azolid Emprazil Nuprin Voltaren  Bayer Aspirin plain, buffered or children's or timed BC Tablets or powders Encaprin Orgaran Warfarin Sodium  Buff-a-Comp Enoxaparin Orudis Zorpin  Buff-a-Comp with Codeine Equegesic Os-Cal-Gesic   Buffaprin Excedrin plain, buffered or Extra Strength Oxalid   Bufferin Arthritis Strength Feldene Oxphenbutazone   Bufferin plain or Extra Strength Feldene Capsules Oxycodone with Aspirin   Bufferin with Codeine Fenoprofen Fenoprofen Pabalate or Pabalate-SF   Buffets II Flogesic Panagesic   Buffinol plain or Extra Strength Florinal or Florinal with Codeine Panwarfarin   Buf-Tabs Flurbiprofen Penicillamine   Butalbital Compound Four-way cold tablets Penicillin   Butazolidin Fragmin Pepto-Bismol   Carbenicillin Geminisyn Percodan   Carna Arthritis Reliever Geopen Persantine   Carprofen Gold's salt Persistin   Chloramphenicol Goody's Phenylbutazone   Chloromycetin Haltrain Piroxlcam   Clmetidine heparin Plaquenil   Cllnoril Hyco-pap Ponstel   Clofibrate Hydroxy chloroquine Propoxyphen         Before stopping any of these medications,  be sure to consult the physician who ordered them.  Some, such as Coumadin (Warfarin) are ordered to prevent or treat serious conditions such as "deep thrombosis", "pumonary embolisms", and other heart problems.  The amount of time that you may need off of the medication may also vary with the medication and the reason for which you were taking it.  If you are taking any of these medications, please make sure you notify your pain physician before you undergo any procedures.         Pain Management Discharge Instructions  General Discharge Instructions :  If you need to reach your doctor call: Monday-Friday 8:00 am - 4:00 pm at [phone] or toll free [phone].  After clinic hours [phone] to have operator reach doctor.  Bring all of your medication bottles to all your appointments  in the pain clinic.  To cancel or reschedule your appointment with Pain Management please remember to call 24 hours in advance to avoid a fee.  Refer to the educational materials which you have been given on: General Risks, I had my Procedure. Discharge Instructions, Post Sedation.  Post Procedure Instructions:  The drugs you were given will stay in your system until tomorrow, so for the next 24 hours you should not drive, make any legal decisions or drink any alcoholic beverages.  You may eat anything you prefer, but it is better to start with liquids then soups and crackers, and gradually work up to solid foods.  Please notify your doctor immediately if you have any unusual bleeding, trouble breathing or pain that is not related to your normal pain.  Depending on the type of procedure that was done, some parts of your body may feel week and/or numb.  This usually clears up by tonight or the next day.  Walk with the use of an assistive device or accompanied by an adult for the 24 hours.  You may use ice on the affected area for the first 24 hours.  Put ice in a Ziploc bag and cover with a towel  and place against area 15 minutes on 15 minutes off.  You may switch to heat after 24 hours.  Oxycodone script given. Scripts to pick up at pharmacy.

## 2015-05-11 NOTE — Progress Notes (Signed)
Safety precautions to be maintained throughout the outpatient stay will include: orient to surroundings, keep bed in low position, maintain call bell within reach at all times, provide assistance with transfer out of bed and ambulation.  

## 2015-05-11 NOTE — Progress Notes (Signed)
Subjective:    Patient ID: Theresa Cobb, female    DOB: 07/06/1966, 49 y.o.   MRN: [id number]  HPI    PROCEDURE PERFORMED: Lumbosacral selective nerve root block   NOTE: The patient is a 49 y.o. female who [facility] for further evaluation and treatment of pain involving the lumbar and lower extremity region. Studies consisting of MRI has revealed the patient to be with evidence of degenerative changes lumbar spine L4-L5 prominent annular disc bulging, right paracentral disc protrusion, spinal stenosis, flattening of the lateral recesses, facet hypertrophy and hypertrophy of the ligamentum flavum contributing to findings with L5-S1 annular disc bulging and narrowing of the left neural foramen. Patient is status post surgical intervention of the lumbar region with significant lumbar lower extremity pain involving the right lower extremity pain especially there is concern regarding radicular component of patient's pain in present. There is concern regarding intraspinal abnormalities contributing to the patient's symptomatology. The risks, benefits, and expectations of the procedure have been explained to the patient who was understanding and in agreement with suggested treatment plan. We will proceed with interventional treatment as discussed and as explained to the patient. The patient is understanding and in agreement with suggested treatment plan.   DESCRIPTION OF PROCEDURE: RIGHT Lumbosacral selective nerve root block with IV Versed, IV fentanyl conscious sedation, EKG, blood pressure, pulse, and pulse oximetry monitoring. The procedure was performed with the patient in the prone position under fluoroscopic guidance. With the patient in the prone position, Betadine prep of proposed entry site was performed. Local anesthetic skin wheal of proposed needle entry site was prepared with 1.5% plain lidocaine with AP view of the lumbosacral spine.   PROCEDURE #1: Needle placement  at the right L 3 vertebral body: A 22 -gauge needle was inserted at the inferior border of the transverse process of the vertebral body with needle placed medial to the midline of the transverse process on AP view of the lumbosacral spine.   NEEDLE PLACEMENT AT  RIGHT L4 and L5 vertebral body levels   Needle  placement was accomplished at right L4 and L5 vertebral body levels  exactly as was accomplished at the L3 vertebral body level utilizing the same technique and under fluoroscopic guidance.  PROCEDURE #4: Needle placement at the S1 foramen. With the patient in the prone position with Betadine prep of proposed entry site accomplished, the S1 foramen was visualized under fluoroscopic guidance with AP view of the lumbosacral spine with cephalad orientation of the fluoroscope with local anesthetic skin wheal of 1.5% lidocaine of proposed needle entry site prepared. A 22-gauge needle was inserted S1 foramen under fluoroscopic guidance eliciting paresthesias radiating from the buttocks to the lower extremity after which needle was slightly withdrawn.   Needle placement was then verified on lateral view at all levels with needle tip documented to be in the posterior superior quadrant of the intervertebral foramen of  L 3, L4, and L5, and needle tip documented at the level of the S1 foramen. Following negative aspiration for heme and CSF at each level, each level was injected with 3 mL of 0.25% bupivacaine with Kenalog.     The patient tolerated the procedure well. A total of 10 mg of Kenalog was utilized for the procedure.   PLAN:  1. Medications: Will continue presently prescribed medications Cymbalta and Neurontin Zanaflex  oxycodone Flector patch and Voltaren gel. 2. The patient is to undergo follow-up evaluation with for evaluation of blood pressure and general  medical condition status post procedure performed on today's visit. 3. Surgical follow-up evaluation. Patient will follow-up with Dr.  Manson Passey [facility] neurosurgery Department department as discussed 4. Neurological evaluation. 5. May consider radiofrequency procedures, implantation type procedures and other treatment pending response to treatment and follow-up evaluation. 6. The patient has been advise do adhere to proper body mechanics and avoid activities which may aggravate condition. 7. The patient has been advised to call the [facility] prior to scheduled return appointment should there be significant change in the patient's condition or should the patient have other concerns regarding condition prior to scheduled return appointment.     Review of Systems     Objective:   Physical Exam        Assessment & Plan:

## 2015-05-12 ENCOUNTER — Telehealth: Payer: Self-pay | Admitting: *Deleted

## 2015-05-12 NOTE — Telephone Encounter (Signed)
Left voice mail

## 2015-05-17 ENCOUNTER — Telehealth: Payer: Self-pay | Admitting: Pain Medicine

## 2015-05-17 NOTE — Telephone Encounter (Signed)
[facility] called saying Dr. Primus Bravo is not covered under medicaid and they cannot fill Oxycodone script for patient  Their number is [phone]

## 2015-05-17 NOTE — Telephone Encounter (Signed)
Dr. Primus Bravo, What does this mean?

## 2015-05-17 NOTE — Telephone Encounter (Signed)
Nurses Call [facility] and billing office to discuss this issue and have Juliann Pulse  assist you in making this call This is a reoccurring issue which is an error on the part of the pharmacy

## 2015-05-18 ENCOUNTER — Telehealth: Payer: Self-pay | Admitting: *Deleted

## 2015-05-19 NOTE — Telephone Encounter (Signed)
[facility] called / she cannot get anyone to fill script / Medicaid # from billing office is [id number] or  Pharmacy can call  Raquel Sarna at [phone]

## 2015-06-01 NOTE — Telephone Encounter (Signed)
none

## 2015-06-03 ENCOUNTER — Telehealth: Payer: Self-pay | Admitting: Pain Medicine

## 2015-06-03 NOTE — Telephone Encounter (Signed)
Pt has to work on Tuesday any way she can come in Monday for med refill. If not Monday can she come on Thursday.

## 2015-06-04 NOTE — Telephone Encounter (Signed)
Theresa Cobb, Please schedule patient for Monday, June 06, 2015.  Please schedule for 7:30 am appointment.

## 2015-06-06 ENCOUNTER — Encounter: Payer: Self-pay | Admitting: Pain Medicine

## 2015-06-06 ENCOUNTER — Ambulatory Visit: Payer: Medicaid Other | Attending: Pain Medicine | Admitting: Pain Medicine

## 2015-06-06 ENCOUNTER — Other Ambulatory Visit: Payer: Self-pay | Admitting: Pain Medicine

## 2015-06-06 VITALS — BP 107/88 | HR 78 | Temp 98.4°F | Resp 16 | Ht 66.0 in | Wt 162.0 lb

## 2015-06-06 DIAGNOSIS — M5416 Radiculopathy, lumbar region: Secondary | ICD-10-CM

## 2015-06-06 DIAGNOSIS — M533 Sacrococcygeal disorders, not elsewhere classified: Secondary | ICD-10-CM | POA: Insufficient documentation

## 2015-06-06 DIAGNOSIS — M5137 Other intervertebral disc degeneration, lumbosacral region: Secondary | ICD-10-CM

## 2015-06-06 DIAGNOSIS — M5136 Other intervertebral disc degeneration, lumbar region: Secondary | ICD-10-CM | POA: Diagnosis not present

## 2015-06-06 DIAGNOSIS — M160 Bilateral primary osteoarthritis of hip: Secondary | ICD-10-CM

## 2015-06-06 DIAGNOSIS — M79605 Pain in left leg: Secondary | ICD-10-CM | POA: Diagnosis present

## 2015-06-06 DIAGNOSIS — M545 Low back pain: Secondary | ICD-10-CM | POA: Diagnosis present

## 2015-06-06 DIAGNOSIS — M5126 Other intervertebral disc displacement, lumbar region: Secondary | ICD-10-CM | POA: Insufficient documentation

## 2015-06-06 DIAGNOSIS — M5481 Occipital neuralgia: Secondary | ICD-10-CM

## 2015-06-06 DIAGNOSIS — M79604 Pain in right leg: Secondary | ICD-10-CM | POA: Diagnosis present

## 2015-06-06 MED ORDER — OXYCODONE HCL 5 MG PO CAPS: ORAL_CAPSULE | ORAL | Status: DC

## 2015-06-06 NOTE — Progress Notes (Signed)
Safety precautions to be maintained throughout the outpatient stay will include: orient to surroundings, keep bed in low position, maintain call bell within reach at all times, provide assistance with transfer out of bed and ambulation.  

## 2015-06-06 NOTE — Progress Notes (Signed)
   Subjective:    Patient ID: Theresa Cobb, female    DOB: 1966/04/11, 49 y.o.   MRN: [id number]  HPI patient is 49 year old female returns to [facility] for further evaluation and treatment of pain involving the lower back and lower extremity region especially the right lower extremity region. Patient is status post surgical intervention of the lumbar region performed [facility]. Patient states that pain is in the region of the buttocks on the right of rather severe degree at this time. Patient denies any trauma change in events of daily living the call significant change in symptomatology. Patient has difficulty climbing steps and states the pain becomes more severe as the day progresses. We discussed patient's condition and will consider patient for interventional treatment to be performed at time of return appointment in attempt to decrease severity of symptoms, minimize progression of symptoms, and avoid the need for more involved treatment. The patient is undergone surgical reevaluation with Dr. Radford Pax who is recommended patient continue treatment and [facility] at this time. The patient was understanding and in agreement with suggested treatment plan.    Review of Systems     Objective:   Physical Exam  There was tends to over the splenius capitis and occipitalis musculature region of mild degree. Mild tenderness of the cervical facet cervical paraspinal musculature region. Mild tenderness over the thoracic facet thoracic paraspinal musculature region. Palpation over the acromioclavicular glenohumeral joint region was without increased pain of any significant degree. Patient appeared to be without grip strength. Phalen's maneuver were without increase of pain significant degree. Palpation over the lower thoracic paraspinal musculature region thoracic facet region was evidence of muscle spasm of moderate degree.  Palpation over the lumbar paraspinal musculature region lumbar facet region associated with moderate discomfort with moderately severe pain With palpation over the PSIS and PII S region. Patient was with positive Patrick's maneuver especially on the right. No definite sensory deficit of dermatomal distribution was detected. There was negative clonus negative Homans. Mild tenderness along the greater trochanteric region iliotibial band region.      Assessment & Plan:   Degenerative disc disease lumbar spine Multilevel degenerative changes of the lumbar spine L4-L5 prominent and disc bulging, right paracentral disc protrusion, flattening of the lateral recesses, facet hypertrophy and hypertrophy of the ligamentum flavum flavum contributing to findings with L5-S1 and disc bulging and narrowing of the left neural foramen  Lumbar facet syndrome  Sacroiliac joint dysfunction    Plan  Continue present medications Cymbalta Zanaflex oxycodone Voltaren gel Flector patch and Lidoderm patch  Block of nerves to sacroiliac joint to be performed at time of return appointment  F/U PCP Dr. Levander Campion for evaliation of  BP and general medical  condition.  F/U surgical evaluation Dr. Carmine Savoy [facility] neurosurgery department  F/U neurological evaluation  May consider radiofrequency rhizolysis or intraspinal procedures pending response to present treatment and F/U evaluation.  Patient to call [facility] should patient have concerns prior to scheduled return appointment.

## 2015-06-06 NOTE — Patient Instructions (Signed)
Continue present medications Cymbalta oxycodone Zanaflex Voltaren gel Flector patch and Lidoderm patch  Block of nerves to the sacroiliac joint to be performed Wednesday, 06/15/2015  F/U PCP for evaliation of  BP and general medical  condition.  F/U surgical evaluation  F/U neurological evaluation  May consider radiofrequency rhizolysis or intraspinal procedures pending response to present treatment and F/U evaluation.  Patient to call [facility] Management Center should patient have concerns prior to scheduled return appointment.

## 2015-06-07 ENCOUNTER — Ambulatory Visit: Payer: Medicaid Other | Admitting: Pain Medicine

## 2015-06-15 ENCOUNTER — Ambulatory Visit: Payer: Medicaid Other | Attending: Pain Medicine | Admitting: Pain Medicine

## 2015-06-15 ENCOUNTER — Encounter: Payer: Self-pay | Admitting: Pain Medicine

## 2015-06-15 VITALS — BP 109/67 | HR 82 | Temp 98.1°F | Resp 14 | Ht 64.0 in | Wt 162.0 lb

## 2015-06-15 DIAGNOSIS — M5481 Occipital neuralgia: Secondary | ICD-10-CM

## 2015-06-15 DIAGNOSIS — M79604 Pain in right leg: Secondary | ICD-10-CM | POA: Diagnosis present

## 2015-06-15 DIAGNOSIS — M533 Sacrococcygeal disorders, not elsewhere classified: Secondary | ICD-10-CM | POA: Diagnosis not present

## 2015-06-15 DIAGNOSIS — M5137 Other intervertebral disc degeneration, lumbosacral region: Secondary | ICD-10-CM

## 2015-06-15 DIAGNOSIS — M545 Low back pain: Secondary | ICD-10-CM | POA: Diagnosis present

## 2015-06-15 DIAGNOSIS — M160 Bilateral primary osteoarthritis of hip: Secondary | ICD-10-CM

## 2015-06-15 DIAGNOSIS — M47816 Spondylosis without myelopathy or radiculopathy, lumbar region: Secondary | ICD-10-CM | POA: Diagnosis not present

## 2015-06-15 DIAGNOSIS — M5416 Radiculopathy, lumbar region: Secondary | ICD-10-CM

## 2015-06-15 DIAGNOSIS — M5136 Other intervertebral disc degeneration, lumbar region: Secondary | ICD-10-CM | POA: Diagnosis not present

## 2015-06-15 DIAGNOSIS — M5126 Other intervertebral disc displacement, lumbar region: Secondary | ICD-10-CM | POA: Insufficient documentation

## 2015-06-15 DIAGNOSIS — M79605 Pain in left leg: Secondary | ICD-10-CM | POA: Diagnosis present

## 2015-06-15 MED ORDER — TRIAMCINOLONE ACETONIDE 40 MG/ML IJ SUSP
INTRAMUSCULAR | Status: AC
  Administered 2015-06-15: 40 mg
  Filled 2015-06-15: qty 1

## 2015-06-15 MED ORDER — ORPHENADRINE CITRATE 30 MG/ML IJ SOLN
INTRAMUSCULAR | Status: AC
  Administered 2015-06-15: 60 mg
  Filled 2015-06-15: qty 2

## 2015-06-15 MED ORDER — CEFUROXIME AXETIL 250 MG PO TABS: 250.0000 mg | ORAL_TABLET | Freq: Two times a day (BID) | ORAL | Status: DC

## 2015-06-15 MED ORDER — MIDAZOLAM HCL 5 MG/5ML IJ SOLN
INTRAMUSCULAR | Status: AC
  Administered 2015-06-15: 5 mg via INTRAVENOUS
  Filled 2015-06-15: qty 5

## 2015-06-15 MED ORDER — FENTANYL CITRATE (PF) 100 MCG/2ML IJ SOLN
INTRAMUSCULAR | Status: AC
  Administered 2015-06-15: 100 ug via INTRAVENOUS
  Filled 2015-06-15: qty 2

## 2015-06-15 MED ORDER — BUPIVACAINE HCL (PF) 0.25 % IJ SOLN
INTRAMUSCULAR | Status: AC
  Administered 2015-06-15: 30 mL
  Filled 2015-06-15: qty 30

## 2015-06-15 MED ORDER — CEFAZOLIN SODIUM 1 G IJ SOLR
INTRAMUSCULAR | Status: AC
  Administered 2015-06-15: 1 g via INTRAVENOUS
  Filled 2015-06-15: qty 10

## 2015-06-15 NOTE — Progress Notes (Signed)
Subjective:    Patient ID: Theresa Cobb, female    DOB: 10-14-1966, 49 y.o.   MRN: [id number]  HPI  PROCEDURE:  Block of nerves to the sacroiliac joint.   NOTE:  The patient is a 49 y.o. female who returns to the [facility] for further evaluation and treatment of pain involving the lower back and lower extremity region with pain in the region of the buttocks as well. Prior  MRI studies reveal Degenerative disc disease lumbar spine Multilevel degenerative changes of the lumbar spine L4-L5 prominent and disc bulging, right paracentral disc protrusion, flattening of the lateral recesses, facet hypertrophy and hypertrophy of the ligamentum flavum flavum contributing to findings with L5-S1 and disc bulging and narrowing of the left neural foramen.   Patient is with reproduction of severe pain with palpation over the PSIS and PII S regions and is with positive Patrick's maneuver There is concern regarding a significant component of the patient's pain being due to sacroiliac joint dysfunction The risks, benefits, expectations of the procedure have been discussed and explained to the patient who is understanding and willing to proceed with interventional treatment in attempt to decrease severity of patient's symptoms, minimize the risk of medication escalation and  hopefully retard the progression of the patient's symptoms. We will proceed with what is felt to be a medically necessary procedure, block of nerves to the sacroiliac joint.   DESCRIPTION OF PROCEDURE:  Block of nerves to the sacroiliac joint.   The patient was taken to the fluoroscopy suite. With the patient in the prone position with EKG, blood pressure, pulse and pulse oximetry monitoring, IV Versed, IV fentanyl conscious sedation, Betadine prep of proposed entry site was performed.   Block of nerves at the L5 vertebral body level.   With the patient in prone position, under fluoroscopic guidance, a 22 -gauge needle was  inserted at the L5 vertebral body level on the  left side. With 15 degrees oblique orientation a 22 -gauge needle was inserted in the region known as Burton's eye or eye of the Scotty dog. Following documentation of needle placement in the area of Burton's eye or eye of the Scotty dog under fluoroscopic guidance, needle placement was then accomplished at the sacral ala level on the  left side.   Needle placement at the sacral ala.   With the patient in prone position under fluoroscopic guidance with AP view of the lumbosacral spine, a 22 -gauge needle was inserted in the region known as the sacral ala on the  left side. Following documentation of needle placement on the left side under fluoroscopic guidance needle placement was then accomplished at the S1 foramen level.   Needle placement at the S1 foramen level.   With the patient in prone position under fluoroscopic guidance with AP view of the lumbosacral spine and cephalad orientation, a 22 -gauge needle was inserted at the superior and lateral border of the S1 foramen on the  left side. Following documentation of needle placement at the S1 foramen level on the  left side, needle placement was then accomplished at the S2 foramen level on the  left side.   Needle placement at the S2 foramen level.   With the patient in prone position with AP view of the lumbosacral spine with cephalad orientation, a 22 - gauge needle was inserted at the superior and lateral border of the S2 foramen under fluoroscopic guidance on the  left side. Following needle placement at the L5 vertebral  body level, sacral ala, S1 foramen and S2 foramen on the  left side, needle placement was verified on lateral view under fluoroscopic guidance.  Following needle placement documentation on lateral view, each needle was injected with 1 mL of 0.25% bupivacaine and Kenalog.   BLOCK OF THE NERVES TO SACROILIAC JOINT ON THE RIGHT SIDE The procedure was performed on the right side at  the same levels as was performed on the left side and utilizing the same technique as on the left side and was performed under fluoroscopic guidance as on the left side   A total of 10mg  of Kenalog was utilized for the procedure.   PLAN:  1. Medications: The patient will continue presently prescribed medications.  2. The patient will be considered for modification of treatment regimen pending response to the procedure performed on today's visit.  3. The patient is to follow-up with primary care physician Dr. Levander Campion for evaluation of blood pressure and general medical condition following the procedure performed on today's visit.  4. Surgical evaluation as discussed. . The patient will follow-up with Dr. Carmine Savoy [facility] neurosurgery Department as discussed 5. Neurological evaluation as discussed.  6. The patient may be a candidate for radiofrequency procedures, implantation devices and other treatment pending response to treatment performed on today's visit and follow-up evaluation.  7. The patient has been advised to adhere to proper body mechanics and to avoid activities which may exacerbate the patient's symptoms.   Return appointment to [facility] as scheduled.      Review of Systems     Objective:   Physical Exam        Assessment & Plan:

## 2015-06-15 NOTE — Patient Instructions (Addendum)
Continue present medication. Please obtain your antibiotic Ceftin today and begin taking Ceftin antibiotic today as prescribed  F/U PCP Dr. Rebeca Alert for evaliation of  BP and general medical  condition  F/U surgical evaluation Dr. Radford Pax  F/U neurological evaluation  May consider radiofrequency rhizolysis or intraspinal procedures pending response to present treatment and F/U evaluation   Patient to call [facility] should patient have concerns prior to scheduled return appointmen.  A script for Ceftin was sent to your pharmacy. Pain Management Discharge Instructions  General Discharge Instructions :  If you need to reach your doctor call: Monday-Friday 8:00 am - 4:00 pm at [phone] or toll free [phone].  After clinic hours [phone] to have operator reach doctor.  Bring all of your medication bottles to all your appointments in the pain clinic.  To cancel or reschedule your appointment with Pain Management please remember to call 24 hours in advance to avoid a fee.  Refer to the educational materials which you have been given on: General Risks, I had my Procedure. Discharge Instructions, Post Sedation.  Post Procedure Instructions:  The drugs you were given will stay in your system until tomorrow, so for the next 24 hours you should not drive, make any legal decisions or drink any alcoholic beverages.  You may eat anything you prefer, but it is better to start with liquids then soups and crackers, and gradually work up to solid foods.  Please notify your doctor immediately if you have any unusual bleeding, trouble breathing or pain that is not related to your normal pain.  Depending on the type of procedure that was done, some parts of your body may feel week and/or numb.  This usually clears up by tonight or the next day.  Walk with the use of an assistive device or accompanied by an adult for the 24 hours.  You may use ice on the affected area for the  first 24 hours.  Put ice in a Ziploc bag and cover with a towel and place against area 15 minutes on 15 minutes off.  You may switch to heat after 24 hours.

## 2015-06-15 NOTE — Progress Notes (Signed)
Safety precautions to be maintained throughout the outpatient stay will include: orient to surroundings, keep bed in low position, maintain call bell within reach at all times, provide assistance with transfer out of bed and ambulation.  

## 2015-06-16 NOTE — Telephone Encounter (Signed)
Message left

## 2015-07-04 ENCOUNTER — Telehealth: Payer: Self-pay | Admitting: Pain Medicine

## 2015-07-04 NOTE — Telephone Encounter (Signed)
Pt wants to know if she can come later then 8:30 on Thursday for her apt.

## 2015-07-04 NOTE — Telephone Encounter (Signed)
Theresa Cobb and Nurses, Yes patient may come later than 8:30 AM for her appointment on Thursday, 07/07/2015

## 2015-07-05 NOTE — Telephone Encounter (Signed)
Pt notified and will be here by 12:00pm on Thursday

## 2015-07-07 ENCOUNTER — Encounter: Payer: Medicaid Other | Admitting: Pain Medicine

## 2015-07-07 ENCOUNTER — Ambulatory Visit: Payer: Medicaid Other | Attending: Pain Medicine | Admitting: Pain Medicine

## 2015-07-07 ENCOUNTER — Encounter: Payer: Self-pay | Admitting: Pain Medicine

## 2015-07-07 VITALS — BP 114/81 | HR 106 | Temp 98.9°F | Resp 16 | Ht 64.0 in | Wt 160.0 lb

## 2015-07-07 DIAGNOSIS — M79605 Pain in left leg: Secondary | ICD-10-CM | POA: Diagnosis present

## 2015-07-07 DIAGNOSIS — M7060 Trochanteric bursitis, unspecified hip: Secondary | ICD-10-CM | POA: Diagnosis not present

## 2015-07-07 DIAGNOSIS — M533 Sacrococcygeal disorders, not elsewhere classified: Secondary | ICD-10-CM | POA: Diagnosis not present

## 2015-07-07 DIAGNOSIS — M47896 Other spondylosis, lumbar region: Secondary | ICD-10-CM | POA: Insufficient documentation

## 2015-07-07 DIAGNOSIS — M545 Low back pain: Secondary | ICD-10-CM | POA: Diagnosis present

## 2015-07-07 DIAGNOSIS — M79604 Pain in right leg: Secondary | ICD-10-CM | POA: Diagnosis present

## 2015-07-07 DIAGNOSIS — M47816 Spondylosis without myelopathy or radiculopathy, lumbar region: Secondary | ICD-10-CM

## 2015-07-07 DIAGNOSIS — M159 Polyosteoarthritis, unspecified: Secondary | ICD-10-CM

## 2015-07-07 DIAGNOSIS — M5126 Other intervertebral disc displacement, lumbar region: Secondary | ICD-10-CM | POA: Diagnosis not present

## 2015-07-07 DIAGNOSIS — M5136 Other intervertebral disc degeneration, lumbar region: Secondary | ICD-10-CM | POA: Insufficient documentation

## 2015-07-07 DIAGNOSIS — M150 Primary generalized (osteo)arthritis: Secondary | ICD-10-CM

## 2015-07-07 DIAGNOSIS — M51379 Other intervertebral disc degeneration, lumbosacral region without mention of lumbar back pain or lower extremity pain: Secondary | ICD-10-CM

## 2015-07-07 DIAGNOSIS — M5137 Other intervertebral disc degeneration, lumbosacral region: Secondary | ICD-10-CM

## 2015-07-07 DIAGNOSIS — M5416 Radiculopathy, lumbar region: Secondary | ICD-10-CM

## 2015-07-07 DIAGNOSIS — M5481 Occipital neuralgia: Secondary | ICD-10-CM

## 2015-07-07 MED ORDER — OXYCODONE HCL 5 MG PO CAPS: ORAL_CAPSULE | ORAL | Status: DC

## 2015-07-07 NOTE — Patient Instructions (Addendum)
Continue present medications Neurontin and Cymbalta Zanaflex Voltaren gel Lidoderm patch Flector patch and oxycodone  Radiofrequency rhizolysis lumbar facet, medial branch nerves, to be performed at time return appointment  F/U PCP Dr. Rebeca Alert for evaliation of  BP and general medical  condition  F/U surgical evaluation  Dr. Manson Passey as discussed  F/U neurological evaluation  May consider radiofrequency rhizolysis or intraspinal procedures pending response to present treatment and F/U evaluation   Patient to call [facility] should patient have concerns prior to scheduled return appointmen.

## 2015-07-07 NOTE — Progress Notes (Signed)
Safety precautions to be maintained throughout the outpatient stay will include: orient to surroundings, keep bed in low position, maintain call bell within reach at all times, provide assistance with transfer out of bed and ambulation.  

## 2015-07-07 NOTE — Progress Notes (Signed)
   Subjective:    Patient ID: Theresa Cobb, female    DOB: Apr 18, 1966, 49 y.o.   MRN: [id number]  HPI  Patient 49 year old female returns to pain management for further evaluation and treatment of pain involving the lower back lower extremity region predominantly patient states she has had some return of pain of the lower back region with increased pressure occurring in the lower back region radiating to the lower extremities. We discussed patient undergoing reevaluation with Dr. Manson Passey neurosurgeon [facility]. We will schedule patient appointment with Dr. Radford Pax at this time. Patient states that the pain is aggravated by twisting turning maneuvers. We will proceed with radiofrequency rhizolysis lumbar facet, medial branch nerves, at time of return appointment as discussed.. The patient has had significant improvement of her pain with previous lumbar facet, medial branch nerve, blocks. We therefore we will proceed with radiofrequency rhizolysis in attempt to decrease severity of patient's symptoms, minimize progression of patient's symptoms and hopefully avoid the need for more involved treatment. The patient was understanding and in agreement status treatment plan. We will continue patient's present medications as discussed and as well.      Review of Systems     Objective:   Physical Exam There was tenderness of the splenius capitis and occipitalis musculature region of mild degree. There was mild tenderness of the cervical facet cervical paraspinal musculature region. There was mild tinnitus of the thoracic facet thoracic paraspinal musculature region. Palpation of the acromioclavicular glenohumeral joint regions reproduced mild discomfort. There was unremarkable Spurling's maneuver. Patient was with bilaterally equal grip strength. Tinel and Phalen's maneuver without increased pain of significant degree. There was no crepitus of the thoracic region noted. Palpation  over the lumbar paraspinal muscles lumbar facet region associated with moderate to moderately severe discomfort with lateral bending and rotation extension and palpation of the lumbar facets reproducing moderate to moderately severe discomfort. Palpation of the PSIS and PII S regions reproduced mild to moderate discomfort. There was mild tenderness of the greater trochanteric region and iliotibial band region. Straight leg raising was limited to approximately 20 without a definite increased pain with dorsiflexion noted. Patient with difficulty attempt attempted to stand on tiptoes and heels.. Lateral bending and rotation extension and palpation of the lumbar facets reproduce moderate to moderately severe discomfort. There was negative clonus negative Homans. Abdomen nontender and no costovertebral tenderness was noted.        Assessment & Plan:    Degenerative disc disease lumbar spine Multilevel degenerative changes of the lumbar spine L4-L5 prominent and disc bulging, right paracentral disc protrusion, flattening of the lateral recesses, facet hypertrophy and hypertrophy of the ligamentum flavum flavum contributing to findings with L5-S1 and disc bulging and narrowing of the left neural foramen  Lumbar facet syndrome  Lumbar radiculopathy  Sacroiliac joint dysfunction  Greater trochanteric bursitis   Plan  Continue present medications Zanaflex and Neurontin Cymbalta oxycodone Voltaren gel Lidoderm patch and Flector patch  Radiofrequency rhizolysis of Lumbar facets, medial branch nerves, to be performed at time of return appointment  F/U PCP Dr. Rebeca Alert  for evaliation of  BP and general medical  condition  F/U surgical evaluation. We will schedule patient for neurosurgical reevaluation with Dr. Manson Passey   F/U neurological evaluation  May consider intraspinal procedures  pending response to present Treatment  Patient to call [facility] Management Center should patient have concerns  prior to scheduled return appointment.

## 2015-07-14 ENCOUNTER — Other Ambulatory Visit: Payer: Self-pay | Admitting: Pain Medicine

## 2015-08-03 ENCOUNTER — Encounter: Payer: Self-pay | Admitting: Pain Medicine

## 2015-08-03 ENCOUNTER — Ambulatory Visit: Payer: Medicaid Other | Attending: Pain Medicine | Admitting: Pain Medicine

## 2015-08-03 VITALS — BP 124/83 | HR 70 | Temp 96.1°F | Resp 14 | Ht 64.0 in | Wt 150.0 lb

## 2015-08-03 DIAGNOSIS — M5137 Other intervertebral disc degeneration, lumbosacral region: Secondary | ICD-10-CM

## 2015-08-03 DIAGNOSIS — M5126 Other intervertebral disc displacement, lumbar region: Secondary | ICD-10-CM | POA: Diagnosis not present

## 2015-08-03 DIAGNOSIS — M51369 Other intervertebral disc degeneration, lumbar region without mention of lumbar back pain or lower extremity pain: Secondary | ICD-10-CM

## 2015-08-03 DIAGNOSIS — M961 Postlaminectomy syndrome, not elsewhere classified: Secondary | ICD-10-CM

## 2015-08-03 DIAGNOSIS — M159 Polyosteoarthritis, unspecified: Secondary | ICD-10-CM

## 2015-08-03 DIAGNOSIS — M5136 Other intervertebral disc degeneration, lumbar region: Secondary | ICD-10-CM | POA: Insufficient documentation

## 2015-08-03 DIAGNOSIS — M150 Primary generalized (osteo)arthritis: Secondary | ICD-10-CM

## 2015-08-03 DIAGNOSIS — M545 Low back pain: Secondary | ICD-10-CM | POA: Diagnosis present

## 2015-08-03 DIAGNOSIS — M5481 Occipital neuralgia: Secondary | ICD-10-CM

## 2015-08-03 DIAGNOSIS — M533 Sacrococcygeal disorders, not elsewhere classified: Secondary | ICD-10-CM

## 2015-08-03 DIAGNOSIS — M5416 Radiculopathy, lumbar region: Secondary | ICD-10-CM

## 2015-08-03 DIAGNOSIS — M47816 Spondylosis without myelopathy or radiculopathy, lumbar region: Secondary | ICD-10-CM

## 2015-08-03 DIAGNOSIS — M461 Sacroiliitis, not elsewhere classified: Secondary | ICD-10-CM

## 2015-08-03 DIAGNOSIS — M62830 Muscle spasm of back: Secondary | ICD-10-CM | POA: Insufficient documentation

## 2015-08-03 DIAGNOSIS — M47818 Spondylosis without myelopathy or radiculopathy, sacral and sacrococcygeal region: Secondary | ICD-10-CM

## 2015-08-03 DIAGNOSIS — M79604 Pain in right leg: Secondary | ICD-10-CM | POA: Diagnosis present

## 2015-08-03 DIAGNOSIS — S335XXS Sprain of ligaments of lumbar spine, sequela: Secondary | ICD-10-CM

## 2015-08-03 DIAGNOSIS — M160 Bilateral primary osteoarthritis of hip: Secondary | ICD-10-CM

## 2015-08-03 DIAGNOSIS — M51379 Other intervertebral disc degeneration, lumbosacral region without mention of lumbar back pain or lower extremity pain: Secondary | ICD-10-CM

## 2015-08-03 DIAGNOSIS — M79605 Pain in left leg: Secondary | ICD-10-CM | POA: Diagnosis present

## 2015-08-03 MED ORDER — BUPIVACAINE HCL (PF) 0.25 % IJ SOLN
INTRAMUSCULAR | Status: AC
  Administered 2015-08-03: 14:00:00
  Filled 2015-08-03: qty 30

## 2015-08-03 MED ORDER — FENTANYL CITRATE (PF) 100 MCG/2ML IJ SOLN
INTRAMUSCULAR | Status: AC
  Administered 2015-08-03: 100 ug via INTRAVENOUS
  Filled 2015-08-03: qty 2

## 2015-08-03 MED ORDER — CEFUROXIME AXETIL 250 MG PO TABS: 250.0000 mg | ORAL_TABLET | Freq: Two times a day (BID) | ORAL | Status: DC

## 2015-08-03 MED ORDER — OXYCODONE HCL 5 MG PO CAPS: ORAL_CAPSULE | ORAL | Status: DC

## 2015-08-03 MED ORDER — LIDOCAINE HCL (PF) 1 % IJ SOLN
INTRAMUSCULAR | Status: AC
  Administered 2015-08-03: 14:00:00
  Filled 2015-08-03: qty 10

## 2015-08-03 MED ORDER — MIDAZOLAM HCL 5 MG/5ML IJ SOLN
INTRAMUSCULAR | Status: AC
  Administered 2015-08-03: 5 mg via INTRAVENOUS
  Filled 2015-08-03: qty 5

## 2015-08-03 MED ORDER — ORPHENADRINE CITRATE 30 MG/ML IJ SOLN
INTRAMUSCULAR | Status: AC
  Administered 2015-08-03: 14:00:00
  Filled 2015-08-03: qty 2

## 2015-08-03 NOTE — Progress Notes (Signed)
Subjective:    Patient ID: Theresa Cobb, female    DOB: Aug 09, 1966, 49 y.o.   MRN: [id number]  HPI  PROCEDURE PERFORMED: Radiofrequency rhizolysis (lunbar facet medial branch nerve radiofrequency rhizolysis).  The procedure was performed with the COOLIEF technique.  COOLIEF technique was performed using the Synergy cooled radiofrequency probe [id number]-17-150-4, the synergy cooled radiofrequency introducer SII-17-150, and the synergy cooled sterile tube kit [id number].   HISTORY: The patient is a 49 y.o. female who returns to the [facility] for further evaluation of severely disabling pain involving the lumbar and lower extremity region Patient also with pain involving the cervical region and thoracic region with severe muscle spasms of the cervical and thoracic region.  MRI revealed degenerative disc disease lumbar spine with multilevel degenerative changes of the lumbar spine L4-L5 prominent and disc bulging, right paracentral disc protrusion, flattening of the lateral recesses, facet hypertrophy and hypertrophy of the ligamentum flavum flavum contributing to findings with L5-S1 and disc bulging and narrowing of the left neural foramen   There is concern regarding significant component of patient's pain being due to facet arthropathy with facet syndrome. The patient has had significant relief of pain following previous lumbar facet, medial branch nerve, blocks. Radiofrequency rhizolysis of the lumbar facets, medial branch nerves, will be performed at this time an attempt to provide longer lasting relief of the patient's pain, minimize progression of the patient's symptoms, and avoid the need for more involved treatment. The risks, benefits and expectations of the procedure have been discussed and explained to the patient who was with understanding and wished to proceed with interventional treatment in an attempt to decrease severely  disabling pain of the lumbar and lower extremity region.  We  will proceed with what is felt to be a medically necessary procedure, radiofrequency rhizolysis (lumber facet medial branch nerve radiofrequency rhizolysis) using the COOLIEF technique.  DESCRIPTION OF PROCEDURE: COOLIEF Technique Radiofrequency Rhizolysis of Lumbar Facets (Medial Branch Nerves Radiofrequency Rhizolysis) with IV Versed, IV Fentanyl conscious sedation, EKG, blood pressure, pulse, and pulse oximetry monitoring.  The procedure was performed with the patient in the prone position under fluoroscopic guidance.  Following Betadine prep of the proposed entry site, a 1.5% lidocaine plain skin wheal of the proposed needle entry site was  prepared in oblique orientation.   right, L4 Radiofrequency Rhizolysis L4 Facet  (Medial Branch Nerve): Under fluoroscopic guidance, the radiofrequency needle was inserted at the L4 vertebral body level after a local anesthetic skin wheal of 1.5% lidocaine plain and Betadine prep of the proposed needle entry site was performed.  The needle was inserted under fluoroscopic guidance in the area known as Burton's eye or eye of the Scotty dog with needle placed at the superior and lateral border of the targeted area with oblique orientation utilizing the tunnel (gun  barrel approach) technique to the targeted area.  right Radiofrequency Rhizolysis at L3 and L2  Lumbar Facets (Medial Branch Nerves) The procedure was performed at L3 and L2 exactly as was performed at the L4 and under fluoroscopic guidance utilizing the identical technique as utilized at the L4 vertebral body level.   Needle was then verified on lateral view and following needle placement verification on lateral view, radiofrequency testing was then carried out with motor testing at 2 Hz stimulation without evidence of stimulation of the lower extremity. Following radiofrequency testing for motor testing, each radiofrequency probe was then prepared with 2 mL of preservative-free lidocaine and following 1  minute of anesthetizing each  proposed radiofrequency lesioning  Level, the radiofrequency probe was then inserted in the right, L4 vertebral body level needle with radiofrequency lesioning carried out for 2-1/2 minutes with temperature of the tissue being maintained at 80 degrees centigrade for 2-1/2 minutes.  Radiofrequency probe was then inserted in the right, L3 vertebral body level needle and radiofrequency lesioning was performed at 80 degrees centigrade tissue temperature for 2-1/2 minutes.  Radiofrequency probe was then inserted in the right,L2 vertebral body level needle and radiofrequency lesioning was performed at 80 degrees centigrade tissue temperature for 2-1/2 minutes  Myoneural block injections of the cervical region Following Betadine prep of proposed entry site a 22-gauge needle was inserted in the cervical musculature region and following negative aspiration 2 cc of 0.25% bupivacaine with Norflex was injected for myoneural block injection of the cervical region 2  The patient tolerated the procedure well.   PLAN: 1. Medications: The patient will continue the present medications Neurontin Cymbalta Zanaflex  oxycodone Flector patch  Lidoderm patch and Voltaren gel 2. The patient will follow up with Dr Rebeca Alert regarding blood pressure and general medical condition as discussed with the patient.  3. Surgical evaluation as discussed. 4. Neurological evaluation as discussed.. Patient will follow-up with Dr. Radford Pax at [facility] neurosurgery department as discussed  5. The patient has been advised to call the Pain Management Center prior to scheduled return appointment should there be significant change in the patient's condition or should the patient have other concerns regarding condition prior to scheduled return appointment.  The patient is with understanding and is in agreement with suggested treatment plan.    Review of Systems     Objective:   Physical Exam        Assessment  & Plan:

## 2015-08-03 NOTE — Progress Notes (Signed)
Safety precautions to be maintained throughout the outpatient stay will include: orient to surroundings, keep bed in low position, maintain call bell within reach at all times, provide assistance with transfer out of bed and ambulation.  
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
## 2015-08-04 ENCOUNTER — Telehealth: Payer: Self-pay

## 2015-08-04 NOTE — Telephone Encounter (Signed)
Denies complications post procedure. 

## 2015-08-10 ENCOUNTER — Telehealth: Payer: Self-pay | Admitting: Pain Medicine

## 2015-08-10 NOTE — Telephone Encounter (Signed)
Theresa Cobb at Dr. Theodosia Blender office needs to know if Theresa Cobb has had an FCE or what reason for the referral? Please call her as this referral has been put on hold.

## 2015-09-01 ENCOUNTER — Encounter: Payer: Self-pay | Admitting: Pain Medicine

## 2015-09-01 ENCOUNTER — Ambulatory Visit: Payer: Medicaid Other | Attending: Pain Medicine | Admitting: Pain Medicine

## 2015-09-01 VITALS — BP 119/74 | HR 80 | Temp 98.0°F | Resp 18 | Ht 64.0 in | Wt 150.0 lb

## 2015-09-01 DIAGNOSIS — M159 Polyosteoarthritis, unspecified: Secondary | ICD-10-CM

## 2015-09-01 DIAGNOSIS — M47818 Spondylosis without myelopathy or radiculopathy, sacral and sacrococcygeal region: Secondary | ICD-10-CM

## 2015-09-01 DIAGNOSIS — M461 Sacroiliitis, not elsewhere classified: Secondary | ICD-10-CM

## 2015-09-01 DIAGNOSIS — M5416 Radiculopathy, lumbar region: Secondary | ICD-10-CM

## 2015-09-01 DIAGNOSIS — M961 Postlaminectomy syndrome, not elsewhere classified: Secondary | ICD-10-CM

## 2015-09-01 DIAGNOSIS — M5481 Occipital neuralgia: Secondary | ICD-10-CM

## 2015-09-01 DIAGNOSIS — S335XXS Sprain of ligaments of lumbar spine, sequela: Secondary | ICD-10-CM

## 2015-09-01 DIAGNOSIS — M545 Low back pain: Secondary | ICD-10-CM | POA: Diagnosis present

## 2015-09-01 DIAGNOSIS — M47816 Spondylosis without myelopathy or radiculopathy, lumbar region: Secondary | ICD-10-CM

## 2015-09-01 DIAGNOSIS — M533 Sacrococcygeal disorders, not elsewhere classified: Secondary | ICD-10-CM | POA: Diagnosis not present

## 2015-09-01 DIAGNOSIS — M51379 Other intervertebral disc degeneration, lumbosacral region without mention of lumbar back pain or lower extremity pain: Secondary | ICD-10-CM

## 2015-09-01 DIAGNOSIS — M7060 Trochanteric bursitis, unspecified hip: Secondary | ICD-10-CM | POA: Insufficient documentation

## 2015-09-01 DIAGNOSIS — M5136 Other intervertebral disc degeneration, lumbar region: Secondary | ICD-10-CM | POA: Diagnosis not present

## 2015-09-01 DIAGNOSIS — M150 Primary generalized (osteo)arthritis: Secondary | ICD-10-CM

## 2015-09-01 DIAGNOSIS — M160 Bilateral primary osteoarthritis of hip: Secondary | ICD-10-CM

## 2015-09-01 DIAGNOSIS — M5137 Other intervertebral disc degeneration, lumbosacral region: Secondary | ICD-10-CM

## 2015-09-01 DIAGNOSIS — M51369 Other intervertebral disc degeneration, lumbar region without mention of lumbar back pain or lower extremity pain: Secondary | ICD-10-CM

## 2015-09-01 MED ORDER — GABAPENTIN 400 MG PO CAPS: ORAL_CAPSULE | ORAL | Status: DC

## 2015-09-01 MED ORDER — OXYCODONE HCL 5 MG PO CAPS: ORAL_CAPSULE | ORAL | Status: DC

## 2015-09-01 MED ORDER — TIZANIDINE HCL 2 MG PO TABS: ORAL_TABLET | ORAL | Status: DC

## 2015-09-01 MED ORDER — DICLOFENAC SODIUM 1 % TD GEL: TRANSDERMAL | Status: DC

## 2015-09-01 MED ORDER — DICLOFENAC EPOLAMINE 1.3 % TD PTCH: MEDICATED_PATCH | TRANSDERMAL | Status: DC

## 2015-09-01 MED ORDER — LIDOCAINE 5 % EX PTCH: MEDICATED_PATCH | CUTANEOUS | Status: DC

## 2015-09-01 MED ORDER — DULOXETINE HCL 30 MG PO CPEP
ORAL_CAPSULE | ORAL | Status: DC
Start: 2015-09-01 — End: 2015-11-09

## 2015-09-01 NOTE — Progress Notes (Signed)
   Subjective:    Patient ID: Theresa Cobb, female    DOB: May 21, 1966, 49 y.o.   MRN: [id number]  HPI  Patient is 49 year old female who returns to [facility] for further evaluation and treatment of pain involving the lumbar lower extremity region. The patient is with improvement of pain following radiofrequency rhizolysis lumbar facet medial branch nerves performed on the right side. We will proceed with radiofrequency rhizolysis lumbar facet medial branch nerves on the left side at time return at time return appointment in attempt to decrease patient's pain significantly as well as avoid need for involved treatment. The patient will undergo follow-up neurosurgical evaluation with Dr. Radford Pax at [facility] as planned. We will continue presently prescribed medications and patient will call pain management Center by scheduled return appointment should there be significant change in condition prior to scheduled return appointment patient was understanding and agrees just plan     Review of Systems     Objective:   Physical Exam  There was mild tenderness of the splenius capitis and occipitalis musculature region. There was mild tinnitus of the cervical facet cervical paraspinal musculature region as well as the thoracic facet thoracic paraspinal muscles region. Patient appeared to be with bilaterally equal grip strength Tinel and Phalen's maneuver without increased pain of significant degree. Palpation over the lumbar paraspinal muscle lumbar facet region with patient to be with well-healed surgical scar the lumbar region without increased warmth or erythema in the region scar there was moderate tends to palpation of paraspinal musculature region lumbar facet region on the left compared to the right. With mild to moderate tends to palpation of the PSIS and PII S regions. Left greater than the right. Straight leg raising was tolerates approximately 20 without increased  pain with dorsiflexion noted. EHL strength appeared to be decreased expressive decreased sensation along the L5 dermatomal distribution detected. Negative clonus negative Homans. There was mild tinnitus of the greater trochanteric region and iliotibial band region. Abdomen soft nontender and no costovertebral angle tenderness was noted.Degenerative disc disease lumbar spine Multilevel degenerative changes of the lumbar spine L4-L5 prominent and disc bulging, right paracentral disc protrusion, flattening of the lateral recesses, facet hypertrophy and hypertrophy of the ligamentum flavum flavum contributing to findings with L5-S1 and disc bulging and narrowing of the left neural foramen  Lumbar facet syndrome  Lumbar radiculopathy  Sacroiliac joint dysfunction  Greater trochanteric bursitis    PLAN   Continue present medications Neurontin and Cymbalta Zanaflex oxycodone Voltaren gel Lidoderm patches and Flector patch  Radiofrequency rhizolysis lumbar facet, medial branch nerves to be performed at time return appointment  F/U PCP Dr. Ayesha Mohair for evaliation of  BP and general medical  condition  F/U surgical evaluation with Dr. Manson Passey neurosurgery department as planned  F/U neurological evaluation. May consider pending follow-up evaluat  Patient to call [facility] Management Center should patient have concerns prior to scheduled return appointment.      Assessment & Plan:
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
## 2015-09-01 NOTE — Progress Notes (Signed)
Safety precautions to be maintained throughout the outpatient stay will include: orient to surroundings, keep bed in low position, maintain call bell within reach at all times, provide assistance with transfer out of bed and ambulation.  

## 2015-09-21 ENCOUNTER — Ambulatory Visit: Payer: Medicaid Other

## 2015-09-21 ENCOUNTER — Ambulatory Visit
Admission: EM | Admit: 2015-09-21 | Discharge: 2015-09-21 | Disposition: A | Discharge status: Home / Self Care | Payer: Medicaid Other | Attending: Family Medicine | Admitting: Family Medicine

## 2015-09-21 DIAGNOSIS — J0100 Acute maxillary sinusitis, unspecified: Secondary | ICD-10-CM | POA: Diagnosis not present

## 2015-09-21 DIAGNOSIS — J069 Acute upper respiratory infection, unspecified: Secondary | ICD-10-CM | POA: Diagnosis present

## 2015-09-21 MED ORDER — BENZONATATE 100 MG PO CAPS: 100.0000 mg | ORAL_CAPSULE | Freq: Three times a day (TID) | ORAL | Status: DC | PRN

## 2015-09-21 MED ORDER — AMOXICILLIN-POT CLAVULANATE 875-125 MG PO TABS: 1.0000 | ORAL_TABLET | Freq: Two times a day (BID) | ORAL | Status: DC
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
## 2015-09-21 NOTE — Discharge Instructions (Signed)
Take medication as prescribed. Rest. Drink plenty of fluids.   Follow up with your primary care physician this week as needed. Return to Urgent care as needed for new or worsening concerns.   Sinusitis, Adult Sinusitis is redness, soreness, and inflammation of the paranasal sinuses. Paranasal sinuses are air pockets within the bones of your face. They are located beneath your eyes, in the middle of your forehead, and above your eyes. In healthy paranasal sinuses, mucus is able to drain out, and air is able to circulate through them by way of your nose. However, when your paranasal sinuses are inflamed, mucus and air can become trapped. This can allow bacteria and other germs to grow and cause infection. Sinusitis can develop quickly and last only a short time (acute) or continue over a long period (chronic). Sinusitis that lasts for more than 12 weeks is considered chronic. CAUSES Causes of sinusitis include:  Allergies.  Structural abnormalities, such as displacement of the cartilage that separates your nostrils (deviated septum), which can decrease the air flow through your nose and sinuses and affect sinus drainage.  Functional abnormalities, such as when the small hairs (cilia) that line your sinuses and help remove mucus do not work properly or are not present. SIGNS AND SYMPTOMS Symptoms of acute and chronic sinusitis are the same. The primary symptoms are pain and pressure around the affected sinuses. Other symptoms include:  Upper toothache.  Earache.  Headache.  Bad breath.  Decreased sense of smell and taste.  A cough, which worsens when you are lying flat.  Fatigue.  Fever.  Thick drainage from your nose, which often is green and may contain pus (purulent).  Swelling and warmth over the affected sinuses. DIAGNOSIS Your health care provider will perform a physical exam. During your exam, your health care provider may perform any of the following to help determine if  you have acute sinusitis or chronic sinusitis:  Look in your nose for signs of abnormal growths in your nostrils (nasal polyps).  Tap over the affected sinus to check for signs of infection.  View the inside of your sinuses using an imaging device that has a light attached (endoscope). If your health care provider suspects that you have chronic sinusitis, one or more of the following tests may be recommended:  Allergy tests.  Nasal culture. A sample of mucus is taken from your nose, sent to a lab, and screened for bacteria.  Nasal cytology. A sample of mucus is taken from your nose and examined by your health care provider to determine if your sinusitis is related to an allergy. TREATMENT Most cases of acute sinusitis are related to a viral infection and will resolve on their own within 10 days. Sometimes, medicines are prescribed to help relieve symptoms of both acute and chronic sinusitis. These may include pain medicines, decongestants, nasal steroid sprays, or saline sprays. However, for sinusitis related to a bacterial infection, your health care provider will prescribe antibiotic medicines. These are medicines that will help kill the bacteria causing the infection. Rarely, sinusitis is caused by a fungal infection. In these cases, your health care provider will prescribe antifungal medicine. For some cases of chronic sinusitis, surgery is needed. Generally, these are cases in which sinusitis recurs more than 3 times per year, despite other treatments. HOME CARE INSTRUCTIONS  Drink plenty of water. Water helps thin the mucus so your sinuses can drain more easily.  Use a humidifier.  Inhale steam 3-4 times a day (for example, sit  in the bathroom with the shower running).  Apply a warm, moist washcloth to your face 3-4 times a day, or as directed by your health care provider.  Use saline nasal sprays to help moisten and clean your sinuses.  Take medicines only as directed by your  health care provider.  If you were prescribed either an antibiotic or antifungal medicine, finish it all even if you start to feel better. SEEK IMMEDIATE MEDICAL CARE IF:  You have increasing pain or severe headaches.  You have nausea, vomiting, or drowsiness.  You have swelling around your face.  You have vision problems.  You have a stiff neck.  You have difficulty breathing.   This information is not intended to replace advice given to you by your health care provider. Make sure you discuss any questions you have with your health care provider.   Document Released: 10/29/2005 Document Revised: 11/19/2014 Document Reviewed: 11/13/2011 Elsevier Interactive Patient Education [employer].

## 2015-09-21 NOTE — ED Notes (Signed)
Pt c/o sore throat, sweats/chills, bilateral otalgia, cough (mostly dry but sometimes productive of yellow sputum, sneezing since Wednesday.

## 2015-09-28 ENCOUNTER — Ambulatory Visit: Payer: Medicaid Other | Attending: Pain Medicine | Admitting: Pain Medicine

## 2015-09-28 ENCOUNTER — Encounter: Payer: Self-pay | Admitting: Pain Medicine

## 2015-09-28 VITALS — BP 121/79 | HR 70 | Temp 98.7°F | Resp 18 | Ht 64.0 in | Wt 150.0 lb

## 2015-09-28 DIAGNOSIS — M47818 Spondylosis without myelopathy or radiculopathy, sacral and sacrococcygeal region: Secondary | ICD-10-CM

## 2015-09-28 DIAGNOSIS — M47816 Spondylosis without myelopathy or radiculopathy, lumbar region: Secondary | ICD-10-CM | POA: Diagnosis not present

## 2015-09-28 DIAGNOSIS — M150 Primary generalized (osteo)arthritis: Secondary | ICD-10-CM

## 2015-09-28 DIAGNOSIS — M5126 Other intervertebral disc displacement, lumbar region: Secondary | ICD-10-CM | POA: Insufficient documentation

## 2015-09-28 DIAGNOSIS — M160 Bilateral primary osteoarthritis of hip: Secondary | ICD-10-CM

## 2015-09-28 DIAGNOSIS — M5481 Occipital neuralgia: Secondary | ICD-10-CM

## 2015-09-28 DIAGNOSIS — M5137 Other intervertebral disc degeneration, lumbosacral region: Secondary | ICD-10-CM

## 2015-09-28 DIAGNOSIS — M545 Low back pain: Secondary | ICD-10-CM | POA: Insufficient documentation

## 2015-09-28 DIAGNOSIS — M51369 Other intervertebral disc degeneration, lumbar region without mention of lumbar back pain or lower extremity pain: Secondary | ICD-10-CM

## 2015-09-28 DIAGNOSIS — M159 Polyosteoarthritis, unspecified: Secondary | ICD-10-CM

## 2015-09-28 DIAGNOSIS — M961 Postlaminectomy syndrome, not elsewhere classified: Secondary | ICD-10-CM

## 2015-09-28 DIAGNOSIS — M79604 Pain in right leg: Secondary | ICD-10-CM | POA: Diagnosis present

## 2015-09-28 DIAGNOSIS — M5416 Radiculopathy, lumbar region: Secondary | ICD-10-CM

## 2015-09-28 DIAGNOSIS — S335XXS Sprain of ligaments of lumbar spine, sequela: Secondary | ICD-10-CM

## 2015-09-28 DIAGNOSIS — M533 Sacrococcygeal disorders, not elsewhere classified: Secondary | ICD-10-CM

## 2015-09-28 DIAGNOSIS — M5136 Other intervertebral disc degeneration, lumbar region: Secondary | ICD-10-CM | POA: Insufficient documentation

## 2015-09-28 DIAGNOSIS — M461 Sacroiliitis, not elsewhere classified: Secondary | ICD-10-CM

## 2015-09-28 DIAGNOSIS — M51379 Other intervertebral disc degeneration, lumbosacral region without mention of lumbar back pain or lower extremity pain: Secondary | ICD-10-CM

## 2015-09-28 DIAGNOSIS — M79605 Pain in left leg: Secondary | ICD-10-CM | POA: Diagnosis present

## 2015-09-28 MED ORDER — LIDOCAINE HCL (PF) 1 % IJ SOLN: 10.0000 mL | Freq: Once | INTRAMUSCULAR | Status: AC

## 2015-09-28 MED ORDER — CEFUROXIME AXETIL 250 MG PO TABS: 250.0000 mg | ORAL_TABLET | Freq: Two times a day (BID) | ORAL | Status: DC

## 2015-09-28 MED ORDER — FENTANYL CITRATE (PF) 100 MCG/2ML IJ SOLN: 100.0000 ug | Freq: Once | INTRAMUSCULAR | Status: AC

## 2015-09-28 MED ORDER — ORPHENADRINE CITRATE 30 MG/ML IJ SOLN: 60.0000 mg | Freq: Once | INTRAMUSCULAR | Status: AC

## 2015-09-28 MED ORDER — LACTATED RINGERS IV SOLN: 1000.0000 mL | INTRAVENOUS | Status: AC

## 2015-09-28 MED ORDER — OXYCODONE HCL 5 MG PO CAPS: ORAL_CAPSULE | ORAL | Status: DC

## 2015-09-28 MED ORDER — CEFAZOLIN SODIUM 1-5 GM-% IV SOLN: 1.0000 g | Freq: Once | INTRAVENOUS | Status: AC

## 2015-09-28 MED ORDER — MIDAZOLAM HCL 5 MG/5ML IJ SOLN: 5.0000 mg | Freq: Once | INTRAMUSCULAR | Status: AC

## 2015-09-28 NOTE — Progress Notes (Signed)
Safety precautions to be maintained throughout the outpatient stay will include: orient to surroundings, keep bed in low position, maintain call bell within reach at all times, provide assistance with transfer out of bed and ambulation.  

## 2015-09-28 NOTE — Progress Notes (Signed)
Subjective:    Patient ID: Theresa Cobb, female    DOB: 05-02-1966, 49 y.o.   MRN: [id number]  HPI  PROCEDURE PERFORMED: Radiofrequency rhizolysis (lunbar facet medial branch nerve radiofrequency rhizolysis).  The procedure was performed with the COOLIEF technique.  COOLIEF technique was performed using the Synergy cooled radiofrequency probe SIP-17-150-4, the synergy cooled radiofrequency introducer SII-17-150, and the synergy cooled sterile tube kit [id number].   HISTORY: The patient is a 49 y.o. female who returns to the [facility] for further evaluation of severely disabling pain involving the lumbar and lower extremity region.  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 The patient has had significant relief of pain following previous lumbar facet, medial branch nerve, blocks. Radiofrequency rhizolysis of the lumbar facets, medial branch nerves, will be performed at this time an attempt to provide longer lasting relief of the patient's pain, minimize progression of the patient's symptoms, and avoid the need for more involved treatment. The risks, benefits and expectations of the procedure have been discussed and explained to the patient who was with understanding and wished to proceed with interventional treatment in an attempt to decrease severely  disabling pain of the lumbar and lower extremity region.  We will proceed with what is felt to be a medically necessary procedure, radiofrequency rhizolysis (lumbar facet medial branch nerve  radiofrequency rhizolysis) using the COOLIEF technique.  DESCRIPTION OF PROCEDURE: COOLIEF Technique Radiofrequency Rhizolysis of Lumbar Facets (Medial Branch Nerves Radiofrequency Rhizolysis) with IV Versed, IV Fentanyl conscious sedation, EKG, blood pressure, pulse, and pulse oximetry monitoring.  The procedure was performed with the patient in the prone position under fluoroscopic guidance.  Following Betadine prep of the proposed entry site, a 1.5% lidocaine plain skin wheal of the proposed needle entry site was  prepared in oblique orientation.    Right,  L3 Radiofrequency Rhizolysis  L3 Facet  (Medial Branch Nerve): Under fluoroscopic guidance, the radiofrequency needle was inserted at the  L3 vertebral body level after a local anesthetic skin wheal of 1.5% lidocaine plain and Betadine prep of the proposed needle entry site was performed.  The needle was inserted under fluoroscopic guidance in the area known as Burton's eye or eye of the Scotty dog with needle placed at the superior and lateral border of the targeted area with oblique orientation utilizing the tunnel (gun  barrel approach) technique to the targeted area.   Right Radiofrequency Rhizolysis at   L2 and  L1  Lumbar Facets (Medial Branch Nerves) The procedure was performed at  L2 and L1 exactly as was performed at the L3 and under fluoroscopic guidance utilizing the identical technique as utilized at the  L3 vertebral body level.    Needle was then verified on lateral view and following needle placement verification on lateral view, radiofrequency testing was then carried out with motor testing at 2 Hz stimulation without evidence of stimulation of the lower extremity. Following radiofrequency testing for motor testing, each radiofrequency probe was then prepared with 2 mL of preservative-free lidocaine and following 1 minute of anesthetizing each proposed radiofrequency lesioning  Level, the radiofrequency probe was then inserted in  the  ight,  L3 vertebral body level needle with radiofrequency lesioning carried out for 2-1/2 minutes with temperature of the tissue being maintained at 80 degrees centigrade for 2-1/2 minutes.  Radiofrequency probe was then inserted in the  right,  L2 vertebral body level needle and radiofrequency lesioning was performed at 80 degrees centigrade tissue temperature for 2-1/2 minutes.  Radiofrequency probe was then inserted in the  Right, L1 vertebral body level needle and radiofrequency lesioning was performed at 80 degrees centigrade tissue temperature for 2-1/2 minutes  The patient tolerated the procedure well.   PLAN: 1. Medications: The patient will continue the present medications. 2. The patient will follow up with  Dr. Rebeca Alert regarding blood pressure and general medical condition as discussed with the patient.  3. Surgical evaluation as discussed. Follow up with  Dr. Radford Pax as dscused 4. Neurological evaluation as discussed.  5. The patient has been advised to call the [facility] prior to scheduled return appointment should there be significant change in the patient's condition or should the patient have other concerns regarding condition prior to scheduled return appointment.  The patient is with understanding and is in agreement with suggested treatment plan.    Review of Systems     Objective:   Physical Exam        Assessment & Plan:

## 2015-09-28 NOTE — Patient Instructions (Addendum)
PLAN  Continue present medication Neurontin Zanaflex  Cymbalta Flector patch and Voltaren gel Lidoderm patch and oxycodone and begin taking antibiotic Ceftin as prescribed. Please obtain your antibiotic Ceftin today and begin taking antibiotic today  F/U PCP Dr.Bender  for evaliation of  BP and general medical  condition.  F/U surgical evaluation. Follow-up with Dr. Radford Pax as discussed  F/U neurological evaluation. May consider PNCV/EMG studies and other studies as discussed  .  Patient to call Pain Management Center should patient have concerns prior to scheduled return appointment. Radiofrequency Lesioning Radiofrequency lesioning is a procedure that is performed to relieve pain. The procedure is often used for back, neck, or arm pain. Radiofrequency lesioning involves the use of a machine that creates radio waves to make heat. During the procedure, the heat is applied to the nerve that carries the pain signal. The heat damages the nerve and interferes with the pain signal. Pain relief usually lasts for 6 months to 1 year. LET [facility] CARE PROVIDER KNOW ABOUT:  Any allergies you have.  All medicines you are taking, including vitamins, herbs, eye drops, creams, and over-the-counter medicines.  Previous problems you or members of your family have had with the use of anesthetics.  Any blood disorders you have.  Previous surgeries you have had.  Any medical conditions you have.  Whether you are pregnant or may be pregnant. RISKS AND COMPLICATIONS Generally, this is a safe procedure. However, problems may occur, including:  Pain or soreness at the injection site.  Infection at the injection site.  Damage to nerves or blood vessels. BEFORE THE PROCEDURE  Ask your health care provider about:  Changing or stopping your regular medicines. This is especially important if you are taking diabetes medicines or blood thinners.  Taking medicines such as aspirin and ibuprofen. These  medicines can thin your blood. Do not take these medicines before your procedure if your health care provider instructs you not to.  Follow instructions from your health care provider about eating or drinking restrictions.  Plan to have someone take you home after the procedure.  If you go home right after the procedure, plan to have someone with you for 24 hours. PROCEDURE  You will be given one or more of the following:  A medicine to help you relax (sedative).  A medicine to numb the area (local anesthetic).  You will be awake during the procedure. You will need to be able to talk with the health care provider during the procedure.  With the help of a type of X-ray (fluoroscopy), the health care provider will insert a radiofrequency needle into the area to be treated.  Next, a wire that carries the radio waves (electrode) will be put through the radiofrequency needle. An electrical pulse will be sent through the electrode to verify the correct nerve. You will feel a tingling sensation, and you may have muscle twitching.  Then, the tissue that is around the needle tip will be heated by an electric current that is passed using the radiofrequency machine. This will numb the nerves.  A bandage (dressing) will be put on the insertion area after the procedure is done. The procedure may vary among health care providers and hospitals. AFTER THE PROCEDURE  Your blood pressure, heart rate, breathing rate, and blood oxygen level will be monitored often until the medicines you were given have worn off.  Return to your normal activities as directed by your health care provider.   This information is not intended  to replace advice given to you by your health care provider. Make sure you discuss any questions you have with your health care provider.   Document Released: 06/27/2011 Document Revised: 07/20/2015 Document Reviewed: 12/06/2014 Elsevier Interactive Patient Education 2016 [employer] Pain Management Discharge Instructions  General Discharge Instructions :  If you need to reach your doctor call: Monday-Friday 8:00 am - 4:00 pm at [phone] or toll free [phone].  After clinic hours [phone] to have operator reach doctor.  Bring all of your medication bottles to all your appointments in the pain clinic.  To cancel or reschedule your appointment with Pain Management please remember to call 24 hours in advance to avoid a fee.  Refer to the educational materials which you have been given on: General Risks, I had my Procedure. Discharge Instructions, Post Sedation.  Post Procedure Instructions:  The drugs you were given will stay in your system until tomorrow, so for the next 24 hours you should not drive, make any legal decisions or drink any alcoholic beverages.  You may eat anything you prefer, but it is better to start with liquids then soups and crackers, and gradually work up to solid foods.  Please notify your doctor immediately if you have any unusual bleeding, trouble breathing or pain that is not related to your normal pain.  Depending on the type of procedure that was done, some parts of your body may feel week and/or numb.  This usually clears up by tonight or the next day.  Walk with the use of an assistive device or accompanied by an adult for the 24 hours.  You may use ice on the affected area for the first 24 hours.  Put ice in a Ziploc bag and cover with a towel and place against area 15 minutes on 15 minutes off.  You may switch to heat after 24 hours.

## 2015-09-29 ENCOUNTER — Telehealth: Payer: Self-pay | Admitting: *Deleted

## 2015-09-29 NOTE — Telephone Encounter (Signed)
Voicemail left re; procedure on 09/28/2015, to call if she has any problems or concerns.

## 2015-10-27 ENCOUNTER — Encounter: Payer: Self-pay | Admitting: Pain Medicine

## 2015-10-27 ENCOUNTER — Ambulatory Visit: Payer: Medicaid Other | Attending: Pain Medicine | Admitting: Pain Medicine

## 2015-10-27 ENCOUNTER — Encounter: Payer: Medicaid Other | Admitting: Pain Medicine

## 2015-10-27 VITALS — BP 130/79 | HR 109 | Temp 98.3°F | Resp 16 | Ht 64.0 in | Wt 150.0 lb

## 2015-10-27 DIAGNOSIS — M961 Postlaminectomy syndrome, not elsewhere classified: Secondary | ICD-10-CM

## 2015-10-27 DIAGNOSIS — M542 Cervicalgia: Secondary | ICD-10-CM | POA: Diagnosis not present

## 2015-10-27 DIAGNOSIS — M5416 Radiculopathy, lumbar region: Secondary | ICD-10-CM

## 2015-10-27 DIAGNOSIS — M150 Primary generalized (osteo)arthritis: Secondary | ICD-10-CM

## 2015-10-27 DIAGNOSIS — M79605 Pain in left leg: Secondary | ICD-10-CM | POA: Diagnosis present

## 2015-10-27 DIAGNOSIS — M7550 Bursitis of unspecified shoulder: Secondary | ICD-10-CM | POA: Diagnosis not present

## 2015-10-27 DIAGNOSIS — M5116 Intervertebral disc disorders with radiculopathy, lumbar region: Secondary | ICD-10-CM | POA: Diagnosis not present

## 2015-10-27 DIAGNOSIS — M546 Pain in thoracic spine: Secondary | ICD-10-CM | POA: Insufficient documentation

## 2015-10-27 DIAGNOSIS — M79604 Pain in right leg: Secondary | ICD-10-CM | POA: Diagnosis present

## 2015-10-27 DIAGNOSIS — M5481 Occipital neuralgia: Secondary | ICD-10-CM

## 2015-10-27 DIAGNOSIS — M5126 Other intervertebral disc displacement, lumbar region: Secondary | ICD-10-CM | POA: Diagnosis not present

## 2015-10-27 DIAGNOSIS — M545 Low back pain: Secondary | ICD-10-CM | POA: Insufficient documentation

## 2015-10-27 DIAGNOSIS — M533 Sacrococcygeal disorders, not elsewhere classified: Secondary | ICD-10-CM | POA: Insufficient documentation

## 2015-10-27 DIAGNOSIS — M461 Sacroiliitis, not elsewhere classified: Secondary | ICD-10-CM

## 2015-10-27 DIAGNOSIS — S335XXS Sprain of ligaments of lumbar spine, sequela: Secondary | ICD-10-CM

## 2015-10-27 DIAGNOSIS — M5136 Other intervertebral disc degeneration, lumbar region: Secondary | ICD-10-CM

## 2015-10-27 DIAGNOSIS — M47818 Spondylosis without myelopathy or radiculopathy, sacral and sacrococcygeal region: Secondary | ICD-10-CM

## 2015-10-27 DIAGNOSIS — M47816 Spondylosis without myelopathy or radiculopathy, lumbar region: Secondary | ICD-10-CM | POA: Insufficient documentation

## 2015-10-27 DIAGNOSIS — M159 Polyosteoarthritis, unspecified: Secondary | ICD-10-CM

## 2015-10-27 DIAGNOSIS — M5137 Other intervertebral disc degeneration, lumbosacral region: Secondary | ICD-10-CM

## 2015-10-27 DIAGNOSIS — M160 Bilateral primary osteoarthritis of hip: Secondary | ICD-10-CM

## 2015-10-27 MED ORDER — OXYCODONE HCL 5 MG PO CAPS: ORAL_CAPSULE | ORAL | Status: DC

## 2015-10-27 NOTE — Progress Notes (Signed)
Safety precautions to be maintained throughout the outpatient stay will include: orient to surroundings, keep bed in low position, maintain call bell within reach at all times, provide assistance with transfer out of bed and ambulation.  
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
## 2015-11-09 ENCOUNTER — Ambulatory Visit: Payer: Medicaid Other | Attending: Pain Medicine | Admitting: Pain Medicine

## 2015-11-09 ENCOUNTER — Encounter: Payer: Self-pay | Admitting: Pain Medicine

## 2015-11-09 VITALS — BP 122/76 | HR 76 | Temp 98.0°F | Resp 18 | Ht 64.0 in | Wt 153.0 lb

## 2015-11-09 DIAGNOSIS — M545 Low back pain: Secondary | ICD-10-CM | POA: Insufficient documentation

## 2015-11-09 DIAGNOSIS — M5481 Occipital neuralgia: Secondary | ICD-10-CM

## 2015-11-09 DIAGNOSIS — M47816 Spondylosis without myelopathy or radiculopathy, lumbar region: Secondary | ICD-10-CM

## 2015-11-09 DIAGNOSIS — M5137 Other intervertebral disc degeneration, lumbosacral region: Secondary | ICD-10-CM

## 2015-11-09 DIAGNOSIS — M5136 Other intervertebral disc degeneration, lumbar region: Secondary | ICD-10-CM | POA: Diagnosis not present

## 2015-11-09 DIAGNOSIS — M160 Bilateral primary osteoarthritis of hip: Secondary | ICD-10-CM

## 2015-11-09 DIAGNOSIS — M5126 Other intervertebral disc displacement, lumbar region: Secondary | ICD-10-CM | POA: Insufficient documentation

## 2015-11-09 DIAGNOSIS — M961 Postlaminectomy syndrome, not elsewhere classified: Secondary | ICD-10-CM

## 2015-11-09 DIAGNOSIS — S335XXS Sprain of ligaments of lumbar spine, sequela: Secondary | ICD-10-CM

## 2015-11-09 DIAGNOSIS — M5416 Radiculopathy, lumbar region: Secondary | ICD-10-CM

## 2015-11-09 DIAGNOSIS — M461 Sacroiliitis, not elsewhere classified: Secondary | ICD-10-CM

## 2015-11-09 DIAGNOSIS — M47818 Spondylosis without myelopathy or radiculopathy, sacral and sacrococcygeal region: Secondary | ICD-10-CM

## 2015-11-09 DIAGNOSIS — M51379 Other intervertebral disc degeneration, lumbosacral region without mention of lumbar back pain or lower extremity pain: Secondary | ICD-10-CM

## 2015-11-09 DIAGNOSIS — M79605 Pain in left leg: Secondary | ICD-10-CM | POA: Diagnosis present

## 2015-11-09 DIAGNOSIS — M79604 Pain in right leg: Secondary | ICD-10-CM | POA: Diagnosis present

## 2015-11-09 DIAGNOSIS — M51369 Other intervertebral disc degeneration, lumbar region without mention of lumbar back pain or lower extremity pain: Secondary | ICD-10-CM

## 2015-11-09 DIAGNOSIS — M150 Primary generalized (osteo)arthritis: Secondary | ICD-10-CM

## 2015-11-09 DIAGNOSIS — M533 Sacrococcygeal disorders, not elsewhere classified: Secondary | ICD-10-CM

## 2015-11-09 DIAGNOSIS — M159 Polyosteoarthritis, unspecified: Secondary | ICD-10-CM

## 2015-11-09 MED ORDER — TRIAMCINOLONE ACETONIDE 40 MG/ML IJ SUSP
INTRAMUSCULAR | Status: AC
  Administered 2015-11-09: 10:00:00
  Filled 2015-11-09: qty 1

## 2015-11-09 MED ORDER — BUPIVACAINE HCL (PF) 0.25 % IJ SOLN: 30.0000 mL | Freq: Once | INTRAMUSCULAR | Status: AC

## 2015-11-09 MED ORDER — TIZANIDINE HCL 2 MG PO TABS: ORAL_TABLET | ORAL | Status: DC

## 2015-11-09 MED ORDER — CEFAZOLIN SODIUM 1-5 GM-% IV SOLN: 1.0000 g | Freq: Once | INTRAVENOUS | Status: AC

## 2015-11-09 MED ORDER — TRIAMCINOLONE ACETONIDE 40 MG/ML IJ SUSP: 40.0000 mg | Freq: Once | INTRAMUSCULAR | Status: AC

## 2015-11-09 MED ORDER — ORPHENADRINE CITRATE 30 MG/ML IJ SOLN: 60.0000 mg | Freq: Once | INTRAMUSCULAR | Status: AC

## 2015-11-09 MED ORDER — CEFAZOLIN SODIUM 1 G IJ SOLR
INTRAMUSCULAR | Status: AC
  Administered 2015-11-09: 1 g via INTRAVENOUS
  Filled 2015-11-09: qty 10

## 2015-11-09 MED ORDER — ORPHENADRINE CITRATE 30 MG/ML IJ SOLN
INTRAMUSCULAR | Status: AC
  Administered 2015-11-09: 11:00:00
  Filled 2015-11-09: qty 2

## 2015-11-09 MED ORDER — GABAPENTIN 400 MG PO CAPS: ORAL_CAPSULE | ORAL | Status: DC

## 2015-11-09 MED ORDER — FENTANYL CITRATE (PF) 100 MCG/2ML IJ SOLN
INTRAMUSCULAR | Status: AC
  Administered 2015-11-09: 100 ug via INTRAVENOUS
  Filled 2015-11-09: qty 2

## 2015-11-09 MED ORDER — MIDAZOLAM HCL 5 MG/5ML IJ SOLN
INTRAMUSCULAR | Status: AC
  Administered 2015-11-09: 5 mg via INTRAVENOUS
  Filled 2015-11-09: qty 5

## 2015-11-09 MED ORDER — FLUCONAZOLE 150 MG PO TABS: ORAL_TABLET | ORAL | Status: DC

## 2015-11-09 MED ORDER — LACTATED RINGERS IV SOLN: 1000.0000 mL | INTRAVENOUS | Status: AC

## 2015-11-09 MED ORDER — BUPIVACAINE HCL (PF) 0.25 % IJ SOLN
INTRAMUSCULAR | Status: AC
  Administered 2015-11-09: 10:00:00
  Filled 2015-11-09: qty 30

## 2015-11-09 MED ORDER — MIDAZOLAM HCL 5 MG/5ML IJ SOLN: 5.0000 mg | Freq: Once | INTRAMUSCULAR | Status: AC

## 2015-11-09 MED ORDER — LIDOCAINE 5 % EX PTCH: MEDICATED_PATCH | CUTANEOUS | Status: DC

## 2015-11-09 MED ORDER — OXYCODONE HCL 5 MG PO CAPS: ORAL_CAPSULE | ORAL | Status: DC

## 2015-11-09 MED ORDER — CEFUROXIME AXETIL 250 MG PO TABS: 250.0000 mg | ORAL_TABLET | Freq: Two times a day (BID) | ORAL | Status: DC

## 2015-11-09 MED ORDER — DULOXETINE HCL 30 MG PO CPEP: ORAL_CAPSULE | ORAL | Status: DC

## 2015-11-09 MED ORDER — DICLOFENAC EPOLAMINE 1.3 % TD PTCH: MEDICATED_PATCH | TRANSDERMAL | Status: DC

## 2015-11-09 MED ORDER — FENTANYL CITRATE (PF) 100 MCG/2ML IJ SOLN: 100.0000 ug | Freq: Once | INTRAMUSCULAR | Status: AC

## 2015-11-09 NOTE — Progress Notes (Signed)
Subjective:    Patient ID: Theresa Cobb, female    DOB: 1966-03-23, 49 y.o.   MRN: [id number]  HPI  PROCEDURE:  Block of nerves to the sacroiliac joint.   NOTE:  The patient is a 49 y.o. female who returns to the [facility] for further evaluation and treatment of pain involving the lower back and lower extremity region with pain in the region of the buttocks as well. Prior MRI studies reveal  Degenerative disc disease lumbar spine Multilevel degenerative changes of the lumbar spine L4-L5 prominent and disc bulging, right paracentral disc protrusion, flattening of the lateral recesses, facet hypertrophy and hypertrophy of the ligamentum flavum flavum contributing to findings with L5-S1 and disc bulging and narrowing of the left neural foramen.  The patient is with reproduction of severe pain with palpation over the PSIS and PII S regions and is with positive Patrick's maneuver There is concern regarding a significant component of the patient's pain being due to sacroiliac joint dysfunction The risks, benefits, expectations of the procedure have been discussed and explained to the patient who is understanding and willing to proceed with interventional treatment in attempt to decrease severity of patient's symptoms, minimize the risk of medication escalation and  hopefully retard the progression of the patient's symptoms. We will proceed with what is felt to be a medically necessary procedure, block of nerves to the sacroiliac joint.   DESCRIPTION OF PROCEDURE:  Block of nerves to the sacroiliac joint.   The patient was taken to the fluoroscopy suite. With the patient in the prone position with EKG, blood pressure, pulse and pulse oximetry monitoring, IV Versed, IV fentanyl conscious sedation, Betadine prep of proposed entry site was performed.   Block of nerves at the L5 vertebral body level.   With the patient in prone position, under fluoroscopic guidance, a 22 -gauge needle was  inserted at the L5 vertebral body level on the left side. With 15 degrees oblique orientation a 22 -gauge needle was inserted in the region known as Burton's eye or eye of the Scotty dog. Following documentation of needle placement in the area of Burton's eye or eye of the Scotty dog under fluoroscopic guidance, needle placement was then accomplished at the sacral ala level on the left side.   Needle placement at the sacral ala.   With the patient in prone position under fluoroscopic guidance with AP view of the lumbosacral spine, a 22 -gauge needle was inserted in the region known as the sacral ala on the left side. Following documentation of needle placement on the left side under fluoroscopic guidance needle placement was then accomplished at the S1 foramen level.   Needle placement at the S1 foramen level.   With the patient in prone position under fluoroscopic guidance with AP view of the lumbosacral spine and cephalad orientation, a 22 -gauge needle was inserted at the superior and lateral border of the S1 foramen on the left side. Following documentation of needle placement at the S1 foramen level on the left side, needle placement was then accomplished at the S2 foramen level on the left side.   Needle placement at the S2 foramen level.   With the patient in prone position with AP view of the lumbosacral spine with cephalad orientation, a 22 - gauge needle was inserted at the superior and lateral border of the S2 foramen under fluoroscopic guidance on the left side. Following needle placement at the L5 vertebral body level, sacral ala, S1 foramen and  S2 foramen on the left left side, needle placement was verified on lateral view under fluoroscopic guidance.  Following needle placement documentation on lateral view, each needle was injected with 1 mL of 0.25% bupivacaine and Kenalog.   BLOCK OF THE NERVES TO SACROILIAC JOINT ON THE RIGHT SIDE The procedure was performed on the right side at  the same levels as was performed on the left side and utilizing the same technique as on the left side and was performed under fluoroscopic guidance as on the left side   A total of 10mg  of Kenalog was utilized for the procedure.   PLAN:  1. Medications: The patient will continue presently prescribed medications. Neurontin Cymbalta Zanaflex Voltaren gel Lidoderm patch Flector patch and oxycodone 2. The patient will be considered for modification of treatment regimen pending response to the procedure performed on today's visit.  3. The patient is to follow-up with primary care physician Dr. Rebeca Alert for evaluation of blood pressure and general medical condition following the procedure performed on today's visit.  4. Surgical evaluation as discussed. . Patient will follow-up with Dr. Radford Pax of [facility] as discussed 5. Neurological evaluation as discussed.  6. The patient may be a candidate for radiofrequency procedures, implantation devices and other treatment pending response to treatment performed on today's visit and follow-up evaluation.  7. The patient has been advised to adhere to proper body mechanics and to avoid activities which may exacerbate the patient's symptoms.   Return appointment to [facility] Management Center as scheduled.    Review of Systems     Objective:   Physical Exam        Assessment & Plan:

## 2015-11-09 NOTE — Progress Notes (Signed)
Safety precautions to be maintained throughout the outpatient stay will include: orient to surroundings, keep bed in low position, maintain call bell within reach at all times, provide assistance with transfer out of bed and ambulation.  

## 2015-11-09 NOTE — Patient Instructions (Addendum)
PLAN  Continue present medication Neurontin Zanaflex  Cymbalta Flector patch and Voltaren gel Lidoderm patch and oxycodone and begin taking antibiotic Ceftin as prescribed. Please obtain your antibiotic Ceftin today and begin taking antibiotic today  F/U PCP Dr.Bender  for evaliation of  BP and general medical  condition.  F/U surgical evaluation. Follow-up with Dr. Radford Pax as discussed  F/U neurological evaluation. May consider PNCV/EMG studies and other studies as discussed    May consider radiofrequency procedures as well as implantation type procedures pending response to treatment and follow-up evaluation.  Patient to call Pain Management Center should patient have concerns prior to scheduled return appointmentPain Management Discharge Instructions  General Discharge Instructions :  If you need to reach your doctor call: Monday-Friday 8:00 am - 4:00 pm at [phone] or toll free [phone].  After clinic hours [phone] to have operator reach doctor.  Bring all of your medication bottles to all your appointments in the pain clinic.  To cancel or reschedule your appointment with Pain Management please remember to call 24 hours in advance to avoid a fee.  Refer to the educational materials which you have been given on: General Risks, I had my Procedure. Discharge Instructions, Post Sedation.  Post Procedure Instructions:  The drugs you were given will stay in your system until tomorrow, so for the next 24 hours you should not drive, make any legal decisions or drink any alcoholic beverages.  You may eat anything you prefer, but it is better to start with liquids then soups and crackers, and gradually work up to solid foods.  Please notify your doctor immediately if you have any unusual bleeding, trouble breathing or pain that is not related to your normal pain.  Depending on the type of procedure that was done, some parts of your body may feel week and/or numb.  This  usually clears up by tonight or the next day.  Walk with the use of an assistive device or accompanied by an adult for the 24 hours.  You may use ice on the affected area for the first 24 hours.  Put ice in a Ziploc bag and cover with a towel and place against area 15 minutes on 15 minutes off.  You may switch to heat after 24 hours.GENERAL RISKS AND COMPLICATIONS  What are the risk, side effects and possible complications? Generally speaking, most procedures are safe.  However, with any procedure there are risks, side effects, and the possibility of complications.  The risks and complications are dependent upon the sites that are lesioned, or the type of nerve block to be performed.  The closer the procedure is to the spine, the more serious the risks are.  Great care is taken when placing the radio frequency needles, block needles or lesioning probes, but sometimes complications can occur. 1. Infection: Any time there is an injection through the skin, there is a risk of infection.  This is why sterile conditions are used for these blocks.  There are four possible types of infection. 1. Localized skin infection. 2. Central Nervous System Infection-This can be in the form of Meningitis, which can be deadly. 3. Epidural Infections-This can be in the form of an epidural abscess, which can cause pressure inside of the spine, causing compression of the spinal cord with subsequent paralysis. This would require an emergency surgery to decompress, and there are no guarantees that the patient would recover from the paralysis. 4. Discitis-This is an infection of the intervertebral discs.  It occurs in  about 1% of discography procedures.  It is difficult to treat and it may lead to surgery.        2. Pain: the needles have to go through skin and soft tissues, will cause soreness.       3. Damage to internal structures:  The nerves to be lesioned may be near blood vessels or    other nerves which can be  potentially damaged.       4. Bleeding: Bleeding is more common if the patient is taking blood thinners such as  aspirin, Coumadin, Ticiid, Plavix, etc., or if he/she have some genetic predisposition  such as hemophilia. Bleeding into the spinal canal can cause compression of the spinal  cord with subsequent paralysis.  This would require an emergency surgery to  decompress and there are no guarantees that the patient would recover from the  paralysis.       5. Pneumothorax:  Puncturing of a lung is a possibility, every time a needle is introduced in  the area of the chest or upper back.  Pneumothorax refers to free air around the  collapsed lung(s), inside of the thoracic cavity (chest cavity).  Another two possible  complications related to a similar event would include: Hemothorax and Chylothorax.   These are variations of the Pneumothorax, where instead of air around the collapsed  lung(s), you may have blood or chyle, respectively.       6. Spinal headaches: They may occur with any procedures in the area of the spine.       7. Persistent CSF (Cerebro-Spinal Fluid) leakage: This is a rare problem, but may occur  with prolonged intrathecal or epidural catheters either due to the formation of a fistulous  track or a dural tear.       8. Nerve damage: By working so close to the spinal cord, there is always a possibility of  nerve damage, which could be as serious as a permanent spinal cord injury with  paralysis.       9. Death:  Although rare, severe deadly allergic reactions known as "Anaphylactic  reaction" can occur to any of the medications used.      10. Worsening of the symptoms:  We can always make thing worse.  What are the chances of something like this happening? Chances of any of this occuring are extremely low.  By statistics, you have more of a chance of getting killed in a motor vehicle accident: while driving to the hospital than any of the above occurring .  Nevertheless, you should be  aware that they are possibilities.  In general, it is similar to taking a shower.  Everybody knows that you can slip, hit your head and get killed.  Does that mean that you should not shower again?  Nevertheless always keep in mind that statistics do not mean anything if you happen to be on the wrong side of them.  Even if a procedure has a 1 (one) in a 1,000,000 (million) chance of going wrong, it you happen to be that one..Also, keep in mind that by statistics, you have more of a chance of having something go wrong when taking medications.  Who should not have this procedure? If you are on a blood thinning medication (e.g. Coumadin, Plavix, see list of "Blood Thinners"), or if you have an active infection going on, you should not have the procedure.  If you are taking any blood thinners, please inform your physician.  How should  I prepare for this procedure?  Do not eat or drink anything at least six hours prior to the procedure.  Bring a driver with you .  It cannot be a taxi.  Come accompanied by an adult that can drive you back, and that is strong enough to help you if your legs get weak or numb from the local anesthetic.  Take all of your medicines the morning of the procedure with just enough water to swallow them.  If you have diabetes, make sure that you are scheduled to have your procedure done first thing in the morning, whenever possible.  If you have diabetes, take only half of your insulin dose and notify our nurse that you have done so as soon as you arrive at the clinic.  If you are diabetic, but only take blood sugar pills (oral hypoglycemic), then do not take them on the morning of your procedure.  You may take them after you have had the procedure.  Do not take aspirin or any aspirin-containing medications, at least eleven (11) days prior to the procedure.  They may prolong bleeding.  Wear loose fitting clothing that may be easy to take off and that you would not mind if it  got stained with Betadine or blood.  Do not wear any jewelry or perfume  Remove any nail coloring.  It will interfere with some of our monitoring equipment.  NOTE: Remember that this is not meant to be interpreted as a complete list of all possible complications.  Unforeseen problems may occur.  BLOOD THINNERS The following drugs contain aspirin or other products, which can cause increased bleeding during surgery and should not be taken for 2 weeks prior to and 1 week after surgery.  If you should need take something for relief of minor pain, you may take acetaminophen which is found in Tylenol,m Datril, Anacin-3 and Panadol. It is not blood thinner. The products listed below are.  Do not take any of the products listed below in addition to any listed on your instruction sheet.  A.P.C or A.P.C with Codeine Codeine Phosphate Capsules #3 Ibuprofen Ridaura  ABC compound Congesprin Imuran rimadil  Advil Cope Indocin Robaxisal  Alka-Seltzer Effervescent Pain Reliever and Antacid Coricidin or Coricidin-D  Indomethacin Rufen  Alka-Seltzer plus Cold Medicine Cosprin Ketoprofen S-A-C Tablets  Anacin Analgesic Tablets or Capsules Coumadin Korlgesic Salflex  Anacin Extra Strength Analgesic tablets or capsules CP-2 Tablets Lanoril Salicylate  Anaprox Cuprimine Capsules Levenox Salocol  Anexsia-D Dalteparin Magan Salsalate  Anodynos Darvon compound Magnesium Salicylate Sine-off  Ansaid Dasin Capsules Magsal Sodium Salicylate  Anturane Depen Capsules Marnal Soma  APF Arthritis pain formula Dewitt's Pills Measurin Stanback  Argesic Dia-Gesic Meclofenamic Sulfinpyrazone  Arthritis Bayer Timed Release Aspirin Diclofenac Meclomen Sulindac  Arthritis pain formula Anacin Dicumarol Medipren Supac  Analgesic (Safety coated) Arthralgen Diffunasal Mefanamic Suprofen  Arthritis Strength Bufferin Dihydrocodeine Mepro Compound Suprol  Arthropan liquid Dopirydamole Methcarbomol with Aspirin Synalgos  ASA  tablets/Enseals Disalcid Micrainin Tagament  Ascriptin Doan's Midol Talwin  Ascriptin A/D Dolene Mobidin Tanderil  Ascriptin Extra Strength Dolobid Moblgesic Ticlid  Ascriptin with Codeine Doloprin or Doloprin with Codeine Momentum Tolectin  Asperbuf Duoprin Mono-gesic Trendar  Aspergum Duradyne Motrin or Motrin IB Triminicin  Aspirin plain, buffered or enteric coated Durasal Myochrisine Trigesic  Aspirin Suppositories Easprin Nalfon Trillsate  Aspirin with Codeine Ecotrin Regular or Extra Strength Naprosyn Uracel  Atromid-S Efficin Naproxen Ursinus  Auranofin Capsules Elmiron Neocylate Vanquish  Axotal Emagrin Norgesic Verin  Azathioprine Empirin or  Empirin with Codeine Normiflo Vitamin E  Azolid Emprazil Nuprin Voltaren  Bayer Aspirin plain, buffered or children's or timed BC Tablets or powders Encaprin Orgaran Warfarin Sodium  Buff-a-Comp Enoxaparin Orudis Zorpin  Buff-a-Comp with Codeine Equegesic Os-Cal-Gesic   Buffaprin Excedrin plain, buffered or Extra Strength Oxalid   Bufferin Arthritis Strength Feldene Oxphenbutazone   Bufferin plain or Extra Strength Feldene Capsules Oxycodone with Aspirin   Bufferin with Codeine Fenoprofen Fenoprofen Pabalate or Pabalate-SF   Buffets II Flogesic Panagesic   Buffinol plain or Extra Strength Florinal or Florinal with Codeine Panwarfarin   Buf-Tabs Flurbiprofen Penicillamine   Butalbital Compound Four-way cold tablets Penicillin   Butazolidin Fragmin Pepto-Bismol   Carbenicillin Geminisyn Percodan   Carna Arthritis Reliever Geopen Persantine   Carprofen Gold's salt Persistin   Chloramphenicol Goody's Phenylbutazone   Chloromycetin Haltrain Piroxlcam   Clmetidine heparin Plaquenil   Cllnoril Hyco-pap Ponstel   Clofibrate Hydroxy chloroquine Propoxyphen         Before stopping any of these medications, be sure to consult the physician who ordered them.  Some, such as Coumadin (Warfarin) are ordered to prevent or treat serious conditions  such as "deep thrombosis", "pumonary embolisms", and other heart problems.  The amount of time that you may need off of the medication may also vary with the medication and the reason for which you were taking it.  If you are taking any of these medications, please make sure you notify your pain physician before you undergo any procedures.  Oxycodone Prescription given

## 2015-11-10 ENCOUNTER — Telehealth: Payer: Self-pay | Admitting: *Deleted

## 2015-11-10 NOTE — Telephone Encounter (Signed)
Left voicemail to call our office if there are any questions or concerns from procedure on yesterday.

## 2015-11-18 ENCOUNTER — Other Ambulatory Visit: Payer: Self-pay | Admitting: Pain Medicine

## 2015-11-24 ENCOUNTER — Ambulatory Visit: Payer: Medicaid Other | Admitting: Pain Medicine

## 2015-12-27 ENCOUNTER — Ambulatory Visit: Payer: Medicaid Other | Attending: Pain Medicine | Admitting: Pain Medicine

## 2015-12-27 ENCOUNTER — Encounter: Payer: Self-pay | Admitting: Pain Medicine

## 2015-12-27 VITALS — BP 118/83 | HR 81 | Temp 98.3°F | Resp 14 | Ht 64.0 in | Wt 160.0 lb

## 2015-12-27 DIAGNOSIS — M461 Sacroiliitis, not elsewhere classified: Secondary | ICD-10-CM

## 2015-12-27 DIAGNOSIS — M545 Low back pain: Secondary | ICD-10-CM | POA: Diagnosis present

## 2015-12-27 DIAGNOSIS — S335XXS Sprain of ligaments of lumbar spine, sequela: Secondary | ICD-10-CM

## 2015-12-27 DIAGNOSIS — M159 Polyosteoarthritis, unspecified: Secondary | ICD-10-CM

## 2015-12-27 DIAGNOSIS — M5126 Other intervertebral disc displacement, lumbar region: Secondary | ICD-10-CM | POA: Diagnosis not present

## 2015-12-27 DIAGNOSIS — M7060 Trochanteric bursitis, unspecified hip: Secondary | ICD-10-CM | POA: Insufficient documentation

## 2015-12-27 DIAGNOSIS — M5481 Occipital neuralgia: Secondary | ICD-10-CM | POA: Diagnosis not present

## 2015-12-27 DIAGNOSIS — M5416 Radiculopathy, lumbar region: Secondary | ICD-10-CM

## 2015-12-27 DIAGNOSIS — M542 Cervicalgia: Secondary | ICD-10-CM | POA: Insufficient documentation

## 2015-12-27 DIAGNOSIS — M79606 Pain in leg, unspecified: Secondary | ICD-10-CM | POA: Diagnosis present

## 2015-12-27 DIAGNOSIS — M5116 Intervertebral disc disorders with radiculopathy, lumbar region: Secondary | ICD-10-CM | POA: Diagnosis not present

## 2015-12-27 DIAGNOSIS — M47896 Other spondylosis, lumbar region: Secondary | ICD-10-CM | POA: Insufficient documentation

## 2015-12-27 DIAGNOSIS — M533 Sacrococcygeal disorders, not elsewhere classified: Secondary | ICD-10-CM | POA: Insufficient documentation

## 2015-12-27 DIAGNOSIS — M5136 Other intervertebral disc degeneration, lumbar region: Secondary | ICD-10-CM

## 2015-12-27 DIAGNOSIS — M5137 Other intervertebral disc degeneration, lumbosacral region: Secondary | ICD-10-CM

## 2015-12-27 DIAGNOSIS — M160 Bilateral primary osteoarthritis of hip: Secondary | ICD-10-CM

## 2015-12-27 DIAGNOSIS — M47818 Spondylosis without myelopathy or radiculopathy, sacral and sacrococcygeal region: Secondary | ICD-10-CM

## 2015-12-27 DIAGNOSIS — M47816 Spondylosis without myelopathy or radiculopathy, lumbar region: Secondary | ICD-10-CM

## 2015-12-27 DIAGNOSIS — M961 Postlaminectomy syndrome, not elsewhere classified: Secondary | ICD-10-CM

## 2015-12-27 DIAGNOSIS — M150 Primary generalized (osteo)arthritis: Secondary | ICD-10-CM

## 2015-12-27 MED ORDER — OXYCODONE HCL 5 MG PO CAPS: ORAL_CAPSULE | ORAL | Status: DC

## 2015-12-27 NOTE — Progress Notes (Signed)
Subjective:    Patient ID: Theresa Cobb, female    DOB: 1966-04-27, 50 y.o.   MRN: [id number]  HPI  The patient is a 50 year old female who returns to pain management for further evaluation and treatment of pain involving the lower back and lower extremity region. The patient is status post surgical intervention of the lumbar region. The patient has undergone reevaluation with Dr. Radford Pax department of neurosurgery [facility] Dr. Radford Pax has recommend patient continue treatment in pain management Center at this time. Patient states that the predominant pain is aggravated by standing walking twisting turning maneuvers. The patient states the pain is a lower back pain that radiates from the back of the buttocks on the left as well as on the right. The pain also is aggravated by attempt to turn over in bed. The patient denies any trauma or change in events of daily living because no change in symptomatology. The patient is continuing to work at this time. We have discussed patient's condition and will proceed with lumbar facet, medial branch nerve, blocks at time return appointment in attempt to decrease severity of symptoms, minimize progression of symptoms, and avoid need for more involved treatment. The patient is in agreement with suggested treatment plan. We will continue presently prescribed medications as discussed and consider modifications of treatment pending response to present treatment and follow-up evaluation. All were in agreement with suggested treatment plan    Review of Systems     Objective:   Physical Exam  There was tenderness to palpation of paraspinal muscular region cervical region cervical facet region palpation which reproduces mild discomfort with mild tenderness over the splenius capitis and occipitalis musculature regions. Palpation of the acromioclavicular and glenohumeral joint regions reproduces minimal discomfort and patient appeared to be with  bilaterally equal grip strength with out significant increase of pain with Tinel and Phalen's maneuver. There was tenderness over the thoracic facet thoracic paraspinal musculature region with no crepitus of the thoracic region noted. Palpation over the lumbar paraspinal musculatures and lumbar facet region associated with moderate to severe discomfort with lateral bending rotation extension and palpation of the lumbar facets reproducing moderately severe discomfort. There was moderate tenderness to moderately severe tenderness to palpation of the PSIS and PII S region. Straight leg raise was tolerates approximately 20 without a definite increased pain with dorsiflexion noted. There appeared to be EHL strength decreased DTRs were difficult to elicit patient had difficulty relaxing. No definite sensory deficit or dermatomal distribution was detected. There was negative clonus negative Homans. Abdomen was nontender with no costovertebral angle tenderness noted      Assessment & Plan:    Sacroiliac joint dysfunction  Degenerative disc disease lumbar spine Multilevel degenerative changes of the lumbar spine L4-L5 prominent and disc bulging, right paracentral disc protrusion, flattening of the lateral recesses, facet hypertrophy and hypertrophy of the ligamentum flavum flavum contributing to findings with L5-S1 and disc bulging and narrowing of the left neural foramen  Lumbar facet syndrome  Lumbar radiculopathy  Greater trochanteric bursitis  Cervicalgia  Bilateral occipital neuralgia     PLAN   Continue present medications . Neurontin Cymbalta Zanaflex oxycodone Voltaren gel Lidoderm patch and Flector patch  Lumbar facet, medial branch nerve, blocks to be performed at time return appointment  F/U PCP Dr. Rebeca Alert for evaliation of  BP and general medical  condition  F/U surgical evaluation. Evaluation with Dr. Radford Pax for neurosurgical reevaluation as discussed.  F/U neurological  evaluation. May consider PNCV/EMG studies  pending follow-up evaluations  May consider radiofrequency rhizolysis or intraspinal procedures pending response to present treatment and F/U evaluation   Patient to call Pain Management Center should patient have concerns prior to scheduled return appointment

## 2015-12-27 NOTE — Patient Instructions (Addendum)
PLAN   Continue present medications . Neurontin Cymbalta Zanaflex oxycodone Voltaren gel Lidoderm patch and Flector patch  Lumbar facet, medial branch nerve, blocks to be performed at time return appointment  F/U PCP Dr. Rebeca Alert for evaliation of  BP and general medical  condition  F/U surgical evaluation. Evaluation with Dr. Radford Pax for neurosurgical reevaluation as discussed.  F/U neurological evaluation. May consider PNCV/EMG studies pending follow-up evaluations  May consider radiofrequency rhizolysis or intraspinal procedures pending response to present treatment and F/U evaluation   Patient to call Pain Management Center should patient have concerns prior to scheduled return appointment.Facet Blocks Patient Information  Description: The facets are joints in the spine between the vertebrae.  Like any joints in the body, facets can become irritated and painful.  Arthritis can also effect the facets.  By injecting steroids and local anesthetic in and around these joints, we can temporarily block the nerve supply to them.  Steroids act directly on irritated nerves and tissues to reduce selling and inflammation which often leads to decreased pain.  Facet blocks may be done anywhere along the spine from the neck to the low back depending upon the location of your pain.   After numbing the skin with local anesthetic (like Novocaine), a small needle is passed onto the facet joints under x-ray guidance.  You may experience a sensation of pressure while this is being done.  The entire block usually lasts about 15-25 minutes.   Conditions which may be treated by facet blocks:   Low back/buttock pain  Neck/shoulder pain  Certain types of headaches  Preparation for the injection:  1. Do not eat any solid food or dairy products within 6 hours of your appointment. 2. You may drink clear liquid up to 2 hours before appointment.  Clear liquids include water, black coffee, juice or soda.  No milk  or cream please. 3. You may take your regular medication, including pain medications, with a sip of water before your appointment.  Diabetics should hold regular insulin (if taken separately) and take 1/2 normal NPH dose the morning of the procedure.  Carry some sugar containing items with you to your appointment. 4. A driver must accompany you and be prepared to drive you home after your procedure. 5. Bring all your current medications with you. 6. An IV may be inserted and sedation may be given at the discretion of the physician. 7. A blood pressure cuff, EKG and other monitors will often be applied during the procedure.  Some patients may need to have extra oxygen administered for a short period. 8. You will be asked to provide medical information, including your allergies and medications, prior to the procedure.  We must know immediately if you are taking blood thinners (like Coumadin/Warfarin) or if you are allergic to IV iodine contrast (dye).  We must know if you could possible be pregnant.  Possible side-effects:   Bleeding from needle site  Infection (rare, may require surgery)  Nerve injury (rare)  Numbness & tingling (temporary)  Difficulty urinating (rare, temporary)  Spinal headache (a headache worse with upright posture)  Light-headedness (temporary)  Pain at injection site (serveral days)  Decreased blood pressure (rare, temporary)  Weakness in arm/leg (temporary)  Pressure sensation in back/neck (temporary)   Call if you experience:   Fever/chills associated with headache or increased back/neck pain  Headache worsened by an upright position  New onset, weakness or numbness of an extremity below the injection site  Hives or difficulty breathing (  go to the emergency room)  Inflammation or drainage at the injection site(s)  Severe back/neck pain greater than usual  New symptoms which are concerning to you  Please note:  Although the local anesthetic  injected can often make your back or neck feel good for several hours after the injection, the pain will likely return. It takes 3-7 days for steroids to work.  You may not notice any pain relief for at least one week.  If effective, we will often do a series of 2-3 injections spaced 3-6 weeks apart to maximally decrease your pain.  After the initial series, you may be a candidate for a more permanent nerve block of the facets.  If you have any questions, please call [phone]  What are the risk, side effects and possible complications? Generally speaking, most procedures are safe.  However, with any procedure there are risks, side effects, and the possibility of complications.  The risks and complications are dependent upon the sites that are lesioned, or the type of nerve block to be performed.  The closer the procedure is to the spine, the more serious the risks are.  Great care is taken when placing the radio frequency needles, block needles or lesioning probes, but sometimes complications can occur. 1. Infection: Any time there is an injection through the skin, there is a risk of infection.  This is why sterile conditions are used for these blocks.  There are four possible types of infection. 1. Localized skin infection. 2. Central Nervous System Infection-This can be in the form of Meningitis, which can be deadly. 3. Epidural Infections-This can be in the form of an epidural abscess, which can cause pressure inside of the spine, causing compression of the spinal cord with subsequent paralysis. This would require an emergency surgery to decompress, and there are no guarantees that the patient would recover from the paralysis. 4. Discitis-This is an infection of the intervertebral discs.  It occurs in about 1% of discography procedures.  It is difficult to treat and it may lead to surgery.        2. Pain: the needles  have to go through skin and soft tissues, will cause soreness.       3. Damage to internal structures:  The nerves to be lesioned may be near blood vessels or    other nerves which can be potentially damaged.       4. Bleeding: Bleeding is more common if the patient is taking blood thinners such as  aspirin, Coumadin, Ticiid, Plavix, etc., or if he/she have some genetic predisposition  such as hemophilia. Bleeding into the spinal canal can cause compression of the spinal  cord with subsequent paralysis.  This would require an emergency surgery to  decompress and there are no guarantees that the patient would recover from the  paralysis.       5. Pneumothorax:  Puncturing of a lung is a possibility, every time a needle is introduced in  the area of the chest or upper back.  Pneumothorax refers to free air around the  collapsed lung(s), inside of the thoracic cavity (chest cavity).  Another two possible  complications related to a similar event would include: Hemothorax and Chylothorax.   These are variations of the Pneumothorax, where instead of air around the collapsed  lung(s), you may have blood or chyle, respectively.       6. Spinal headaches: They may occur with  any procedures in the area of the spine.       7. Persistent CSF (Cerebro-Spinal Fluid) leakage: This is a rare problem, but may occur  with prolonged intrathecal or epidural catheters either due to the formation of a fistulous  track or a dural tear.       8. Nerve damage: By working so close to the spinal cord, there is always a possibility of  nerve damage, which could be as serious as a permanent spinal cord injury with  paralysis.       9. Death:  Although rare, severe deadly allergic reactions known as "Anaphylactic  reaction" can occur to any of the medications used.      10. Worsening of the symptoms:  We can always make thing worse.  What are the chances of something like this happening? Chances of any of this occuring are extremely  low.  By statistics, you have more of a chance of getting killed in a motor vehicle accident: while driving to the hospital than any of the above occurring .  Nevertheless, you should be aware that they are possibilities.  In general, it is similar to taking a shower.  Everybody knows that you can slip, hit your head and get killed.  Does that mean that you should not shower again?  Nevertheless always keep in mind that statistics do not mean anything if you happen to be on the wrong side of them.  Even if a procedure has a 1 (one) in a 1,000,000 (million) chance of going wrong, it you happen to be that one..Also, keep in mind that by statistics, you have more of a chance of having something go wrong when taking medications.  Who should not have this procedure? If you are on a blood thinning medication (e.g. Coumadin, Plavix, see list of "Blood Thinners"), or if you have an active infection going on, you should not have the procedure.  If you are taking any blood thinners, please inform your physician.  How should I prepare for this procedure?  Do not eat or drink anything at least six hours prior to the procedure.  Bring a driver with you .  It cannot be a taxi.  Come accompanied by an adult that can drive you back, and that is strong enough to help you if your legs get weak or numb from the local anesthetic.  Take all of your medicines the morning of the procedure with just enough water to swallow them.  If you have diabetes, make sure that you are scheduled to have your procedure done first thing in the morning, whenever possible.  If you have diabetes, take only half of your insulin dose and notify our nurse that you have done so as soon as you arrive at the clinic.  If you are diabetic, but only take blood sugar pills (oral hypoglycemic), then do not take them on the morning of your procedure.  You may take them after you have had the procedure.  Do not take aspirin or any  aspirin-containing medications, at least eleven (11) days prior to the procedure.  They may prolong bleeding.  Wear loose fitting clothing that may be easy to take off and that you would not mind if it got stained with Betadine or blood.  Do not wear any jewelry or perfume  Remove any nail coloring.  It will interfere with some of our monitoring equipment.  NOTE: Remember that this is not meant to be interpreted as a  complete list of all possible complications.  Unforeseen problems may occur.  BLOOD THINNERS The following drugs contain aspirin or other products, which can cause increased bleeding during surgery and should not be taken for 2 weeks prior to and 1 week after surgery.  If you should need take something for relief of minor pain, you may take acetaminophen which is found in Tylenol,m Datril, Anacin-3 and Panadol. It is not blood thinner. The products listed below are.  Do not take any of the products listed below in addition to any listed on your instruction sheet.  A.P.C or A.P.C with Codeine Codeine Phosphate Capsules #3 Ibuprofen Ridaura  ABC compound Congesprin Imuran rimadil  Advil Cope Indocin Robaxisal  Alka-Seltzer Effervescent Pain Reliever and Antacid Coricidin or Coricidin-D  Indomethacin Rufen  Alka-Seltzer plus Cold Medicine Cosprin Ketoprofen S-A-C Tablets  Anacin Analgesic Tablets or Capsules Coumadin Korlgesic Salflex  Anacin Extra Strength Analgesic tablets or capsules CP-2 Tablets Lanoril Salicylate  Anaprox Cuprimine Capsules Levenox Salocol  Anexsia-D Dalteparin Magan Salsalate  Anodynos Darvon compound Magnesium Salicylate Sine-off  Ansaid Dasin Capsules Magsal Sodium Salicylate  Anturane Depen Capsules Marnal Soma  APF Arthritis pain formula Dewitt's Pills Measurin Stanback  Argesic Dia-Gesic Meclofenamic Sulfinpyrazone  Arthritis Bayer Timed Release Aspirin Diclofenac Meclomen Sulindac  Arthritis pain formula Anacin Dicumarol Medipren Supac  Analgesic  (Safety coated) Arthralgen Diffunasal Mefanamic Suprofen  Arthritis Strength Bufferin Dihydrocodeine Mepro Compound Suprol  Arthropan liquid Dopirydamole Methcarbomol with Aspirin Synalgos  ASA tablets/Enseals Disalcid Micrainin Tagament  Ascriptin Doan's Midol Talwin  Ascriptin A/D Dolene Mobidin Tanderil  Ascriptin Extra Strength Dolobid Moblgesic Ticlid  Ascriptin with Codeine Doloprin or Doloprin with Codeine Momentum Tolectin  Asperbuf Duoprin Mono-gesic Trendar  Aspergum Duradyne Motrin or Motrin IB Triminicin  Aspirin plain, buffered or enteric coated Durasal Myochrisine Trigesic  Aspirin Suppositories Easprin Nalfon Trillsate  Aspirin with Codeine Ecotrin Regular or Extra Strength Naprosyn Uracel  Atromid-S Efficin Naproxen Ursinus  Auranofin Capsules Elmiron Neocylate Vanquish  Axotal Emagrin Norgesic Verin  Azathioprine Empirin or Empirin with Codeine Normiflo Vitamin E  Azolid Emprazil Nuprin Voltaren  Bayer Aspirin plain, buffered or children's or timed BC Tablets or powders Encaprin Orgaran Warfarin Sodium  Buff-a-Comp Enoxaparin Orudis Zorpin  Buff-a-Comp with Codeine Equegesic Os-Cal-Gesic   Buffaprin Excedrin plain, buffered or Extra Strength Oxalid   Bufferin Arthritis Strength Feldene Oxphenbutazone   Bufferin plain or Extra Strength Feldene Capsules Oxycodone with Aspirin   Bufferin with Codeine Fenoprofen Fenoprofen Pabalate or Pabalate-SF   Buffets II Flogesic Panagesic   Buffinol plain or Extra Strength Florinal or Florinal with Codeine Panwarfarin   Buf-Tabs Flurbiprofen Penicillamine   Butalbital Compound Four-way cold tablets Penicillin   Butazolidin Fragmin Pepto-Bismol   Carbenicillin Geminisyn Percodan   Carna Arthritis Reliever Geopen Persantine   Carprofen Gold's salt Persistin   Chloramphenicol Goody's Phenylbutazone   Chloromycetin Haltrain Piroxlcam   Clmetidine heparin Plaquenil   Cllnoril Hyco-pap Ponstel   Clofibrate Hydroxy chloroquine  Propoxyphen         Before stopping any of these medications, be sure to consult the physician who ordered them.  Some, such as Coumadin (Warfarin) are ordered to prevent or treat serious conditions such as "deep thrombosis", "pumonary embolisms", and other heart problems.  The amount of time that you may need off of the medication may also vary with the medication and the reason for which you were taking it.  If you are taking any of these medications, please make sure you notify your pain physician before you  undergo any procedures.

## 2015-12-27 NOTE — Progress Notes (Signed)
Safety precautions to be maintained throughout the outpatient stay will include: orient to surroundings, keep bed in low position, maintain call bell within reach at all times, provide assistance with transfer out of bed and ambulation.  

## 2016-01-25 ENCOUNTER — Encounter: Payer: Self-pay | Admitting: Pain Medicine

## 2016-01-25 ENCOUNTER — Ambulatory Visit: Payer: Medicaid Other | Attending: Pain Medicine | Admitting: Pain Medicine

## 2016-01-25 VITALS — BP 107/82 | HR 84 | Temp 98.2°F | Resp 18 | Ht 64.0 in | Wt 160.0 lb

## 2016-01-25 DIAGNOSIS — M545 Low back pain: Secondary | ICD-10-CM | POA: Insufficient documentation

## 2016-01-25 DIAGNOSIS — M150 Primary generalized (osteo)arthritis: Secondary | ICD-10-CM

## 2016-01-25 DIAGNOSIS — M461 Sacroiliitis, not elsewhere classified: Secondary | ICD-10-CM

## 2016-01-25 DIAGNOSIS — M5137 Other intervertebral disc degeneration, lumbosacral region: Secondary | ICD-10-CM

## 2016-01-25 DIAGNOSIS — M47816 Spondylosis without myelopathy or radiculopathy, lumbar region: Secondary | ICD-10-CM

## 2016-01-25 DIAGNOSIS — M159 Polyosteoarthritis, unspecified: Secondary | ICD-10-CM

## 2016-01-25 DIAGNOSIS — M51379 Other intervertebral disc degeneration, lumbosacral region without mention of lumbar back pain or lower extremity pain: Secondary | ICD-10-CM

## 2016-01-25 DIAGNOSIS — M160 Bilateral primary osteoarthritis of hip: Secondary | ICD-10-CM

## 2016-01-25 DIAGNOSIS — M47818 Spondylosis without myelopathy or radiculopathy, sacral and sacrococcygeal region: Secondary | ICD-10-CM

## 2016-01-25 DIAGNOSIS — M5136 Other intervertebral disc degeneration, lumbar region: Secondary | ICD-10-CM | POA: Diagnosis not present

## 2016-01-25 DIAGNOSIS — M5126 Other intervertebral disc displacement, lumbar region: Secondary | ICD-10-CM | POA: Diagnosis not present

## 2016-01-25 DIAGNOSIS — S335XXS Sprain of ligaments of lumbar spine, sequela: Secondary | ICD-10-CM

## 2016-01-25 DIAGNOSIS — M533 Sacrococcygeal disorders, not elsewhere classified: Secondary | ICD-10-CM

## 2016-01-25 DIAGNOSIS — M961 Postlaminectomy syndrome, not elsewhere classified: Secondary | ICD-10-CM

## 2016-01-25 DIAGNOSIS — M5416 Radiculopathy, lumbar region: Secondary | ICD-10-CM

## 2016-01-25 DIAGNOSIS — M51369 Other intervertebral disc degeneration, lumbar region without mention of lumbar back pain or lower extremity pain: Secondary | ICD-10-CM

## 2016-01-25 DIAGNOSIS — M5481 Occipital neuralgia: Secondary | ICD-10-CM

## 2016-01-25 MED ORDER — TIZANIDINE HCL 2 MG PO TABS: ORAL_TABLET | ORAL | Status: DC

## 2016-01-25 MED ORDER — OXYCODONE HCL 5 MG PO CAPS: ORAL_CAPSULE | ORAL | Status: DC

## 2016-01-25 MED ORDER — DICLOFENAC EPOLAMINE 1.3 % TD PTCH: MEDICATED_PATCH | TRANSDERMAL | Status: DC

## 2016-01-25 MED ORDER — BUPIVACAINE HCL (PF) 0.25 % IJ SOLN
INTRAMUSCULAR | Status: AC
  Administered 2016-01-25: 14:00:00
  Filled 2016-01-25: qty 30

## 2016-01-25 MED ORDER — GABAPENTIN 400 MG PO CAPS: ORAL_CAPSULE | ORAL | Status: DC

## 2016-01-25 MED ORDER — BUPIVACAINE HCL (PF) 0.25 % IJ SOLN: 30.0000 mL | Freq: Once | INTRAMUSCULAR | Status: AC

## 2016-01-25 MED ORDER — DICLOFENAC SODIUM 1 % TD GEL: TRANSDERMAL | Status: DC

## 2016-01-25 MED ORDER — ORPHENADRINE CITRATE 30 MG/ML IJ SOLN: 60.0000 mg | Freq: Once | INTRAMUSCULAR | Status: AC

## 2016-01-25 MED ORDER — CEFUROXIME AXETIL 250 MG PO TABS: 250.0000 mg | ORAL_TABLET | Freq: Two times a day (BID) | ORAL | Status: AC

## 2016-01-25 MED ORDER — DULOXETINE HCL 30 MG PO CPEP: ORAL_CAPSULE | ORAL | Status: DC

## 2016-01-25 MED ORDER — LACTATED RINGERS IV SOLN: 1000.0000 mL | INTRAVENOUS | Status: AC

## 2016-01-25 MED ORDER — TRIAMCINOLONE ACETONIDE 40 MG/ML IJ SUSP
INTRAMUSCULAR | Status: AC
  Administered 2016-01-25: 14:00:00
  Filled 2016-01-25: qty 1

## 2016-01-25 MED ORDER — CEFAZOLIN SODIUM 1-5 GM-% IV SOLN
1.0000 g | Freq: Once | INTRAVENOUS | Status: AC
Start: 2016-01-25 — End: ?

## 2016-01-25 MED ORDER — MIDAZOLAM HCL 5 MG/5ML IJ SOLN
INTRAMUSCULAR | Status: AC
  Administered 2016-01-25: 5 mg via INTRAVENOUS
  Filled 2016-01-25: qty 5

## 2016-01-25 MED ORDER — CEFAZOLIN SODIUM 1 G IJ SOLR
INTRAMUSCULAR | Status: AC
  Administered 2016-01-25: 1 g via INTRAVENOUS
  Filled 2016-01-25: qty 10

## 2016-01-25 MED ORDER — ORPHENADRINE CITRATE 30 MG/ML IJ SOLN
INTRAMUSCULAR | Status: AC
  Administered 2016-01-25: 14:00:00
  Filled 2016-01-25: qty 2

## 2016-01-25 MED ORDER — MIDAZOLAM HCL 5 MG/5ML IJ SOLN: 5.0000 mg | Freq: Once | INTRAMUSCULAR | Status: AC

## 2016-01-25 MED ORDER — TRIAMCINOLONE ACETONIDE 40 MG/ML IJ SUSP: 40.0000 mg | Freq: Once | INTRAMUSCULAR | Status: AC

## 2016-01-25 MED ORDER — LIDOCAINE 5 % EX PTCH: MEDICATED_PATCH | CUTANEOUS | Status: DC

## 2016-01-25 MED ORDER — FENTANYL CITRATE (PF) 100 MCG/2ML IJ SOLN: 100.0000 ug | Freq: Once | INTRAMUSCULAR | Status: AC

## 2016-01-25 MED ORDER — FENTANYL CITRATE (PF) 100 MCG/2ML IJ SOLN
INTRAMUSCULAR | Status: AC
  Administered 2016-01-25: 100 ug via INTRAVENOUS
  Filled 2016-01-25: qty 2

## 2016-01-25 NOTE — Progress Notes (Signed)
Subjective:    Patient ID: Theresa Cobb, female    DOB: 06/05/1966, 50 y.o.   MRN: [id number]  HPI  PROCEDURE PERFORMED: Lumbar facet (medial branch block)   NOTE: The patient is a 50 y.o. female who returns to [facility] for further evaluation and treatment of pain involving the lumbar and lower extremity region.  MRI  revealed the patient to be with evidence of degenerative disc disease lumbar spine Multilevel degenerative changes of the lumbar spine L4-L5 prominent and disc bulging, right paracentral disc protrusion, flattening of the lateral recesses, facet hypertrophy and hypertrophy of the ligamentum flavum flavum contributing to findings with L5-S1 and disc bulging and narrowing of the left neural foramen.. Patient has significant facet syndrome.  The risks, benefits, and expectations of the procedure have been discussed and explained to the patient who was understanding and in agreement with suggested treatment plan. We will proceed with interventional treatment as discussed and as explained to the patient who was understanding and wished to proceed with procedure as planned.   DESCRIPTION OF PROCEDURE: Lumbar facet (medial branch block) with IV Versed, IV fentanyl conscious sedation, EKG, blood pressure, pulse, and pulse oximetry monitoring. The procedure was performed with the patient in the prone position. Betadine prep of proposed entry site performed.   NEEDLE PLACEMENT AT:  left L 1 lumbar facet (medial branch block). Under fluoroscopic guidance with oblique orientation of 15 degrees, a 22-gauge needle was inserted at the L  1 vertebral body level with needle placed at the targeted area of Burton's Eye or Eye of the Scotty Dog with documentation of needle placement in the superior and lateral border of targeted area of Burton's Eye or Eye of the Scotty Dog with oblique orientation of 15 degrees. Following documentation of needle placement at the L 1 vertebral body level,  needle placement was then accomplished at the L 2 vertebral body level.   NEEDLE PLACEMENT AT L2 and L3  VERTEBRAL BODY LEVELS ON THE LEFT SIDE The procedure was performed at the L2 and L3 vertebral body levels exactly as was performed at the L 1 vertebral body level utilizing the same technique and under fluoroscopic guidance.  NEEDLE PLACEMENT AT THE SACRAL ALA with AP view of the lumbosacral spine. With the patient in the prone position, Betadine prep of proposed entry site accomplished, a 22 gauge needle was inserted in the region of the sacral ala (groove formed by the superior articulating process of S1 and the sacral wing). Following documentation of needle placement at the sacral ala,  needle placement was then accomplished at the S1 foramen level.   NEEDLE PLACEMENT AT THE S1 FORAMEN LEVEL under fluoroscopic guidance with AP view of the lumbosacral spine and cephalad orientation of the fluoroscope, a 22-gauge needle was placed at the superior and lateral border of the S1 foramen under fluoroscopic guidance. Following documentation of needle placement at the S1 foramen.   Needle placement was then verified at all levels on lateral view. Following documentation of needle placement at all levels on lateral view and following negative aspiration for heme and CSF, each level was injected with 1 mL of 0.25% bupivacaine with Kenalog.     LUMBAR FACET, MEDIAL BRANCH NERVE, BLOCKS PERFORMED ON THE RIGHT SIDE   The procedure was performed on the right side exactly as was performed on the left side at the same levels and utilizing the same technique under fluoroscopic guidance.     The patient tolerated the procedure well.  A total of 40 mg of Kenalog was utilized for the procedure.   PLAN:  1. Medications: The patient will continue presently prescribed medications.Neurontin Cymbalta Zanaflex oxycodone Voltaren Gel  Flector patch and Lidoderm patch 2. May consider modification of treatment  regimen at time of return appointment pending response to treatment rendered on today's visit. 3. The patient is to follow-up with primary care physician  Dr Rebeca Alert for further evaluation of blood pressure and general medical condition status post steroid injection performed on today's visit. 4. Surgical follow-up evaluation with Dr Radford Pax  5. Neurological follow-up evaluation.Consider PNCV/EMG'S 6. The patient may be candidate for radiofrequency procedures, implantation type procedures, and other treatment pending response to treatment and follow-up evaluation. 7. The patient has been advised to call the Pain Management Center prior to scheduled return appointment should there be significant change in condition or should patient have other concerns regarding condition prior to scheduled return appointment.  The patient is understanding and in agreement with suggested treatment plan.   Review of Systems     Objective:   Physical Exam        Assessment & Plan:

## 2016-01-25 NOTE — Progress Notes (Signed)
Safety precautions to be maintained throughout the outpatient stay will include: orient to surroundings, keep bed in low position, maintain call bell within reach at all times, provide assistance with transfer out of bed and ambulation.   Patient here for procedure visit for low back pain

## 2016-01-25 NOTE — Patient Instructions (Addendum)
PLAN   Continue present medications . Neurontin Cymbalta Zanaflex oxycodone Voltaren gel Lidoderm patch and Flector patch Please obtain Ceftin antibiotic today and begin taking Ceftin antibiotic as prescribed  F/U PCP Dr. Rebeca Alert for evaliation of  BP and general medical  condition  F/U surgical evaluation. Evaluation with Dr. Radford Pax for neurosurgical reevaluation as discussed.   F/U neurological evaluation. May consider PNCV EMG studies and other studies pending follow-up evaluations  May consider radiofrequency rhizolysis or intraspinal procedures pending response to present treatment and F/U evaluation   Patient to call Pain Management Center should patient have concerns prior to scheduled return appointment.Pain Management Discharge Instructions  General Discharge Instructions :  If you need to reach your doctor call: Monday-Friday 8:00 am - 4:00 pm at [phone] or toll free [phone].  After clinic hours [phone] to have operator reach doctor.  Bring all of your medication bottles to all your appointments in the pain clinic.  To cancel or reschedule your appointment with Pain Management please remember to call 24 hours in advance to avoid a fee.  Refer to the educational materials which you have been given on: General Risks, I had my Procedure. Discharge Instructions, Post Sedation.  Post Procedure Instructions:  The drugs you were given will stay in your system until tomorrow, so for the next 24 hours you should not drive, make any legal decisions or drink any alcoholic beverages.  You may eat anything you prefer, but it is better to start with liquids then soups and crackers, and gradually work up to solid foods.  Please notify your doctor immediately if you have any unusual bleeding, trouble breathing or pain that is not related to your normal pain.  Depending on the type of procedure that was done, some parts of your body may feel week and/or numb.  This usually  clears up by tonight or the next day.  Walk with the use of an assistive device or accompanied by an adult for the 24 hours.  You may use ice on the affected area for the first 24 hours.  Put ice in a Ziploc bag and cover with a towel and place against area 15 minutes on 15 minutes off.  You may switch to heat after 24 hours.

## 2016-02-03 ENCOUNTER — Other Ambulatory Visit: Payer: Self-pay | Admitting: Pain Medicine

## 2016-02-23 ENCOUNTER — Ambulatory Visit: Payer: Medicaid Other | Attending: Pain Medicine | Admitting: Pain Medicine

## 2016-02-23 ENCOUNTER — Encounter: Payer: Self-pay | Admitting: Pain Medicine

## 2016-02-23 VITALS — BP 120/77 | HR 78 | Temp 98.2°F | Resp 15 | Ht 64.0 in | Wt 160.0 lb

## 2016-02-23 DIAGNOSIS — S335XXS Sprain of ligaments of lumbar spine, sequela: Secondary | ICD-10-CM

## 2016-02-23 DIAGNOSIS — M5481 Occipital neuralgia: Secondary | ICD-10-CM | POA: Diagnosis not present

## 2016-02-23 DIAGNOSIS — M5136 Other intervertebral disc degeneration, lumbar region: Secondary | ICD-10-CM

## 2016-02-23 DIAGNOSIS — M5137 Other intervertebral disc degeneration, lumbosacral region: Secondary | ICD-10-CM

## 2016-02-23 DIAGNOSIS — M7060 Trochanteric bursitis, unspecified hip: Secondary | ICD-10-CM | POA: Insufficient documentation

## 2016-02-23 DIAGNOSIS — M5126 Other intervertebral disc displacement, lumbar region: Secondary | ICD-10-CM | POA: Insufficient documentation

## 2016-02-23 DIAGNOSIS — M160 Bilateral primary osteoarthritis of hip: Secondary | ICD-10-CM

## 2016-02-23 DIAGNOSIS — M5116 Intervertebral disc disorders with radiculopathy, lumbar region: Secondary | ICD-10-CM | POA: Diagnosis not present

## 2016-02-23 DIAGNOSIS — M533 Sacrococcygeal disorders, not elsewhere classified: Secondary | ICD-10-CM

## 2016-02-23 DIAGNOSIS — M5127 Other intervertebral disc displacement, lumbosacral region: Secondary | ICD-10-CM | POA: Insufficient documentation

## 2016-02-23 DIAGNOSIS — M542 Cervicalgia: Secondary | ICD-10-CM | POA: Insufficient documentation

## 2016-02-23 DIAGNOSIS — M47816 Spondylosis without myelopathy or radiculopathy, lumbar region: Secondary | ICD-10-CM

## 2016-02-23 DIAGNOSIS — M961 Postlaminectomy syndrome, not elsewhere classified: Secondary | ICD-10-CM

## 2016-02-23 DIAGNOSIS — M159 Polyosteoarthritis, unspecified: Secondary | ICD-10-CM

## 2016-02-23 DIAGNOSIS — M47896 Other spondylosis, lumbar region: Secondary | ICD-10-CM | POA: Diagnosis not present

## 2016-02-23 DIAGNOSIS — M47818 Spondylosis without myelopathy or radiculopathy, sacral and sacrococcygeal region: Secondary | ICD-10-CM

## 2016-02-23 DIAGNOSIS — M150 Primary generalized (osteo)arthritis: Secondary | ICD-10-CM

## 2016-02-23 DIAGNOSIS — M545 Low back pain: Secondary | ICD-10-CM | POA: Diagnosis present

## 2016-02-23 DIAGNOSIS — M461 Sacroiliitis, not elsewhere classified: Secondary | ICD-10-CM

## 2016-02-23 DIAGNOSIS — M5416 Radiculopathy, lumbar region: Secondary | ICD-10-CM

## 2016-02-23 DIAGNOSIS — M546 Pain in thoracic spine: Secondary | ICD-10-CM | POA: Diagnosis present

## 2016-02-23 MED ORDER — OXYCODONE HCL 5 MG PO CAPS: ORAL_CAPSULE | ORAL | Status: DC

## 2016-02-23 MED ORDER — TIZANIDINE HCL 2 MG PO TABS: ORAL_TABLET | ORAL | Status: DC

## 2016-02-23 NOTE — Progress Notes (Signed)
Safety precautions to be maintained throughout the outpatient stay will include: orient to surroundings, keep bed in low position, maintain call bell within reach at all times, provide assistance with transfer out of bed and ambulation.  

## 2016-02-23 NOTE — Progress Notes (Signed)
   Subjective:    Patient ID: Theresa Cobb, female    DOB: 1966/08/17, 50 y.o.   MRN: [id number]  HPI  The patient is a 50 year old female who returns to pain management for further evaluation and treatment of pain involving the mid lower back lower extremity region with pain increased with standing walking twisting turning maneuvers. The patient is status post surgical intervention of the lumbar region and will undergo surgical follow-up evaluation with Dr. Radford Pax as discussed. Present time patient states the pain radiation the lumbar region for the lower extremity continuing distally to the foot. Patient is without trauma change in events of daily living because change in symptomatology. We discussed patient's condition and will continue presently prescribed medications as well as will consider patient for interventional treatment should her symptoms persist. The patient was with understanding and agreed to suggested treatment plan    Review of Systems     Objective:   Physical Exam  There was tenderness of the splenius capitis and occipitalis muscles region a mild degree with mild tenderness of the cervical facet cervical paraspinal musculature region palpation over the acromioclavicular and glenohumeral joint regions were with mild tinnitus to palpation and patient appeared to be unremarkable Spurling's maneuver. The patient appeared to be with bilaterally equal grip strength and Tinel and Phalen's maneuver were without increased pain of significant degree. Palpation over the lumbar paraspinal must reason lumbar facet region associated with moderate tenderness to palpation with well-healed surgical scar of the lumbar region without increased warmth and erythema region scar. Palpation over the PSIS and PII S region reproduces moderate discomfort with palpation over the lumbar facets lumbar paraspinal musculature region as well as the PSIS and PII S region reproduced most significant portion of  patient's pain. DTRs were difficult to elicit. No definite sensory deficit or dermatomal distribution detected. Negative clonus negative Homans. Abdomen nontender with no costovertebral tenderness noted      Assessment & Plan:      Degenerative disc disease lumbar spine Multilevel degenerative changes of the lumbar spine L4-L5 prominent and disc bulging, right paracentral disc protrusion, flattening of the lateral recesses, facet hypertrophy and hypertrophy of the ligamentum flavum flavum contributing to findings with L5-S1 and disc bulging and narrowing of the left neural foramen  Lumbar facet syndrome  Lumbar radiculopathy  Greater trochanteric bursitis  Cervicalgia  Bilateral occipital neuralgia      PLAN   Continue present medications . Neurontin Cymbalta Zanaflex oxycodone Voltaren gel Lidoderm patch and Flector patch Please obtain Ceftin antibiotic today and begin taking Ceftin antibiotic as prescribed  F/U PCP Dr. Rebeca Alert for evaliation of  BP and general medical  condition  F/U surgical evaluation. Evaluation with Dr. Radford Pax for neurosurgical reevaluation as discussed and especially if the weakness of the lower extremities continues or increases.   F/U neurological evaluation. May consider [facility] EMG studies and other studies pending follow-up evaluations  May consider radiofrequency rhizolysis or intraspinal procedures pending response to present treatment and F/U evaluation   Patient to call [facility] Management Center should patient have concerns prior to scheduled return appointment.

## 2016-02-23 NOTE — Patient Instructions (Addendum)
PLAN   Continue present medications . Neurontin Cymbalta Zanaflex oxycodone Voltaren gel Lidoderm patch and Flector patch Please obtain Ceftin antibiotic today and begin taking Ceftin antibiotic as prescribed  F/U PCP Dr. Rebeca Alert for evaliation of  BP and general medical  condition  F/U surgical evaluation. Evaluation with Dr. Radford Pax for neurosurgical reevaluation as discussed and especially if the weakness of the lower extremities continues or increases.   F/U neurological evaluation. May consider [facility] EMG studies and other studies pending follow-up evaluations  May consider radiofrequency rhizolysis or intraspinal procedures pending response to present treatment and F/U evaluation   Patient to call [facility] Management Center should patient have concerns prior to scheduled return appointment.

## 2016-03-15 ENCOUNTER — Telehealth: Payer: Self-pay | Admitting: *Deleted

## 2016-03-15 NOTE — Telephone Encounter (Signed)
RETURNED PT CALL TWICE FIRST TIME NO ANSWER, AND THE SECOND TIME PT PLACED ME ON HOLD AND NEVER CAME BACK TO THE PHONE.Theresa KitchenMarland KitchenTD

## 2016-03-22 ENCOUNTER — Encounter: Payer: Medicaid Other | Admitting: Pain Medicine

## 2016-03-22 ENCOUNTER — Encounter: Payer: Self-pay | Admitting: Pain Medicine

## 2016-03-22 ENCOUNTER — Ambulatory Visit: Payer: Medicaid Other | Attending: Pain Medicine | Admitting: Pain Medicine

## 2016-03-22 VITALS — BP 103/84 | HR 73 | Temp 98.1°F | Resp 16 | Ht 64.0 in | Wt 160.0 lb

## 2016-03-22 DIAGNOSIS — M961 Postlaminectomy syndrome, not elsewhere classified: Secondary | ICD-10-CM

## 2016-03-22 DIAGNOSIS — M159 Polyosteoarthritis, unspecified: Secondary | ICD-10-CM

## 2016-03-22 DIAGNOSIS — M5137 Other intervertebral disc degeneration, lumbosacral region: Secondary | ICD-10-CM

## 2016-03-22 DIAGNOSIS — S335XXS Sprain of ligaments of lumbar spine, sequela: Secondary | ICD-10-CM

## 2016-03-22 DIAGNOSIS — M160 Bilateral primary osteoarthritis of hip: Secondary | ICD-10-CM

## 2016-03-22 DIAGNOSIS — M5136 Other intervertebral disc degeneration, lumbar region: Secondary | ICD-10-CM

## 2016-03-22 DIAGNOSIS — M5481 Occipital neuralgia: Secondary | ICD-10-CM

## 2016-03-22 DIAGNOSIS — M150 Primary generalized (osteo)arthritis: Secondary | ICD-10-CM

## 2016-03-22 DIAGNOSIS — M47816 Spondylosis without myelopathy or radiculopathy, lumbar region: Secondary | ICD-10-CM

## 2016-03-22 DIAGNOSIS — M47818 Spondylosis without myelopathy or radiculopathy, sacral and sacrococcygeal region: Secondary | ICD-10-CM

## 2016-03-22 DIAGNOSIS — M533 Sacrococcygeal disorders, not elsewhere classified: Secondary | ICD-10-CM

## 2016-03-22 DIAGNOSIS — M461 Sacroiliitis, not elsewhere classified: Secondary | ICD-10-CM

## 2016-03-22 DIAGNOSIS — M5416 Radiculopathy, lumbar region: Secondary | ICD-10-CM

## 2016-03-22 MED ORDER — OXYCODONE HCL 5 MG PO CAPS: ORAL_CAPSULE | ORAL | Status: DC

## 2016-03-22 NOTE — Progress Notes (Signed)
Safety precautions to be maintained throughout the outpatient stay will include: orient to surroundings, keep bed in low position, maintain call bell within reach at all times, provide assistance with transfer out of bed and ambulation.  

## 2016-03-22 NOTE — Patient Instructions (Addendum)
PLAN   Continue present medications . Neurontin Cymbalta Zanaflex oxycodone Voltaren gel Lidoderm patch and Flector patch.  Block of nerves to the sacroiliac joint to be performed at time of return appointment  F/U PCP Dr. Rebeca Alert for evaliation of  BP and general medical  condition  F/U surgical evaluation. Evaluation with Dr. Radford Pax for neurosurgical reevaluation as discussed and especially if the weakness of the lower extremities continues or increases.   F/U neurological evaluation. May consider [facility] EMG studies and other studies pending follow-up evaluations  May consider radiofrequency rhizolysis or intraspinal procedures pending response to present treatment and F/U evaluation   Ask the nurses and secretary the date of your MRI of the left knee  Patient to call [facility] Management Center should patient have concerns prior to scheduled return appointment.Sacroiliac (SI) Joint Injection Patient Information  Description: The sacroiliac joint connects the scrum (very low back and tailbone) to the ilium (a pelvic bone which also forms half of the hip joint).  Normally this joint experiences very little motion.  When this joint becomes inflamed or unstable low back and or hip and pelvis pain may result.  Injection of this joint with local anesthetics (numbing medicines) and steroids can provide diagnostic information and reduce pain.  This injection is performed with the aid of x-ray guidance into the tailbone area while you are lying on your stomach.   You may experience an electrical sensation down the leg while this is being done.  You may also experience numbness.  We also may ask if we are reproducing your normal pain during the injection.  Conditions which may be treated SI injection:   Low back, buttock, hip or leg pain  Preparation for the Injection:  1. Do not eat any solid food or dairy products within 8 hours of your appointment.  2. You may drink clear liquids up to 3 hours  before appointment.  Clear liquids include water, black coffee, juice or soda.  No milk or cream please. 3. You may take your regular medications, including pain medications with a sip of water before your appointment.  Diabetics should hold regular insulin (if take separately) and take 1/2 normal NPH dose the morning of the procedure.  Carry some sugar containing items with you to your appointment. 4. A driver must accompany you and be prepared to drive you home after your procedure. 5. Bring all of your current medications with you. 6. An IV may be inserted and sedation may be given at the discretion of the physician. 7. A blood pressure cuff, EKG and other monitors will often be applied during the procedure.  Some patients may need to have extra oxygen administered for a short period.  8. You will be asked to provide medical information, including your allergies, prior to the procedure.  We must know immediately if you are taking blood thinners (like Coumadin/Warfarin) or if you are allergic to IV iodine contrast (dye).  We must know if you could possible be pregnant.  Possible side effects:   Bleeding from needle site  Infection (rare, may require surgery)  Nerve injury (rare)  Numbness & tingling (temporary)  A brief convulsion or seizure  Light-headedness (temporary)  Pain at injection site (several days)  Decreased blood pressure (temporary)  Weakness in the leg (temporary)   Call if you experience:   New onset weakness or numbness of an extremity below the injection site that last more than 8 hours.  Hives or difficulty breathing ( go to  the emergency room)  Inflammation or drainage at the injection site  Any new symptoms which are concerning to you  Please note:  Although the local anesthetic injected can often make your back/ hip/ buttock/ leg feel good for several hours after the injections, the pain will likely return.  It takes 3-7 days for steroids to work in  the sacroiliac area.  You may not notice any pain relief for at least that one week.  If effective, we will often do a series of three injections spaced 3-6 weeks apart to maximally decrease your pain.  After the initial series, we generally will wait some months before a repeat injection of the same type.  If you have any questions, please call [phone] [place]  What are the risk, side effects and possible complications? Generally speaking, most procedures are safe.  However, with any procedure there are risks, side effects, and the possibility of complications.  The risks and complications are dependent upon the sites that are lesioned, or the type of nerve block to be performed.  The closer the procedure is to the spine, the more serious the risks are.  Great care is taken when placing the radio frequency needles, block needles or lesioning probes, but sometimes complications can occur. 1. Infection: Any time there is an injection through the skin, there is a risk of infection.  This is why sterile conditions are used for these blocks.  There are four possible types of infection. 1. Localized skin infection. 2. Central Nervous System Infection-This can be in the form of Meningitis, which can be deadly. 3. Epidural Infections-This can be in the form of an epidural abscess, which can cause pressure inside of the spine, causing compression of the spinal cord with subsequent paralysis. This would require an emergency surgery to decompress, and there are no guarantees that the patient would recover from the paralysis. 4. Discitis-This is an infection of the intervertebral discs.  It occurs in about 1% of discography procedures.  It is difficult to treat and it may lead to surgery.        2. Pain: the needles have to go through skin and soft tissues, will cause soreness.       3. Damage to internal structures:  The nerves to  be lesioned may be near blood vessels or    other nerves which can be potentially damaged.       4. Bleeding: Bleeding is more common if the patient is taking blood thinners such as  aspirin, Coumadin, Ticiid, Plavix, etc., or if he/she have some genetic predisposition  such as hemophilia. Bleeding into the spinal canal can cause compression of the spinal  cord with subsequent paralysis.  This would require an emergency surgery to  decompress and there are no guarantees that the patient would recover from the  paralysis.       5. Pneumothorax:  Puncturing of a lung is a possibility, every time a needle is introduced in  the area of the chest or upper back.  Pneumothorax refers to free air around the  collapsed lung(s), inside of the thoracic cavity (chest cavity).  Another two possible  complications related to a similar event would include: Hemothorax and Chylothorax.   These are variations of the Pneumothorax, where instead of air around the collapsed  lung(s), you may have blood or chyle, respectively.       6. Spinal headaches: They may occur  with any procedures in the area of the spine.       7. Persistent CSF (Cerebro-Spinal Fluid) leakage: This is a rare problem, but may occur  with prolonged intrathecal or epidural catheters either due to the formation of a fistulous  track or a dural tear.       8. Nerve damage: By working so close to the spinal cord, there is always a possibility of  nerve damage, which could be as serious as a permanent spinal cord injury with  paralysis.       9. Death:  Although rare, severe deadly allergic reactions known as "Anaphylactic  reaction" can occur to any of the medications used.      10. Worsening of the symptoms:  We can always make thing worse.  What are the chances of something like this happening? Chances of any of this occuring are extremely low.  By statistics, you have more of a chance of getting killed in a motor vehicle accident: while driving to the  hospital than any of the above occurring .  Nevertheless, you should be aware that they are possibilities.  In general, it is similar to taking a shower.  Everybody knows that you can slip, hit your head and get killed.  Does that mean that you should not shower again?  Nevertheless always keep in mind that statistics do not mean anything if you happen to be on the wrong side of them.  Even if a procedure has a 1 (one) in a 1,000,000 (million) chance of going wrong, it you happen to be that one..Also, keep in mind that by statistics, you have more of a chance of having something go wrong when taking medications.  Who should not have this procedure? If you are on a blood thinning medication (e.g. Coumadin, Plavix, see list of "Blood Thinners"), or if you have an active infection going on, you should not have the procedure.  If you are taking any blood thinners, please inform your physician.  How should I prepare for this procedure?  Do not eat or drink anything at least six hours prior to the procedure.  Bring a driver with you .  It cannot be a taxi.  Come accompanied by an adult that can drive you back, and that is strong enough to help you if your legs get weak or numb from the local anesthetic.  Take all of your medicines the morning of the procedure with just enough water to swallow them.  If you have diabetes, make sure that you are scheduled to have your procedure done first thing in the morning, whenever possible.  If you have diabetes, take only half of your insulin dose and notify our nurse that you have done so as soon as you arrive at the clinic.  If you are diabetic, but only take blood sugar pills (oral hypoglycemic), then do not take them on the morning of your procedure.  You may take them after you have had the procedure.  Do not take aspirin or any aspirin-containing medications, at least eleven (11) days prior to the procedure.  They may prolong bleeding.  Wear loose fitting  clothing that may be easy to take off and that you would not mind if it got stained with Betadine or blood.  Do not wear any jewelry or perfume  Remove any nail coloring.  It will interfere with some of our monitoring equipment.  NOTE: Remember that this is not meant to be interpreted as  a complete list of all possible complications.  Unforeseen problems may occur.  BLOOD THINNERS The following drugs contain aspirin or other products, which can cause increased bleeding during surgery and should not be taken for 2 weeks prior to and 1 week after surgery.  If you should need take something for relief of minor pain, you may take acetaminophen which is found in Tylenol,m Datril, Anacin-3 and Panadol. It is not blood thinner. The products listed below are.  Do not take any of the products listed below in addition to any listed on your instruction sheet.  A.P.C or A.P.C with Codeine Codeine Phosphate Capsules #3 Ibuprofen Ridaura  ABC compound Congesprin Imuran rimadil  Advil Cope Indocin Robaxisal  Alka-Seltzer Effervescent Pain Reliever and Antacid Coricidin or Coricidin-D  Indomethacin Rufen  Alka-Seltzer plus Cold Medicine Cosprin Ketoprofen S-A-C Tablets  Anacin Analgesic Tablets or Capsules Coumadin Korlgesic Salflex  Anacin Extra Strength Analgesic tablets or capsules CP-2 Tablets Lanoril Salicylate  Anaprox Cuprimine Capsules Levenox Salocol  Anexsia-D Dalteparin Magan Salsalate  Anodynos Darvon compound Magnesium Salicylate Sine-off  Ansaid Dasin Capsules Magsal Sodium Salicylate  Anturane Depen Capsules Marnal Soma  APF Arthritis pain formula Dewitt's Pills Measurin Stanback  Argesic Dia-Gesic Meclofenamic Sulfinpyrazone  Arthritis Bayer Timed Release Aspirin Diclofenac Meclomen Sulindac  Arthritis pain formula Anacin Dicumarol Medipren Supac  Analgesic (Safety coated) Arthralgen Diffunasal Mefanamic Suprofen  Arthritis Strength Bufferin Dihydrocodeine Mepro Compound Suprol   Arthropan liquid Dopirydamole Methcarbomol with Aspirin Synalgos  ASA tablets/Enseals Disalcid Micrainin Tagament  Ascriptin Doan's Midol Talwin  Ascriptin A/D Dolene Mobidin Tanderil  Ascriptin Extra Strength Dolobid Moblgesic Ticlid  Ascriptin with Codeine Doloprin or Doloprin with Codeine Momentum Tolectin  Asperbuf Duoprin Mono-gesic Trendar  Aspergum Duradyne Motrin or Motrin IB Triminicin  Aspirin plain, buffered or enteric coated Durasal Myochrisine Trigesic  Aspirin Suppositories Easprin Nalfon Trillsate  Aspirin with Codeine Ecotrin Regular or Extra Strength Naprosyn Uracel  Atromid-S Efficin Naproxen Ursinus  Auranofin Capsules Elmiron Neocylate Vanquish  Axotal Emagrin Norgesic Verin  Azathioprine Empirin or Empirin with Codeine Normiflo Vitamin E  Azolid Emprazil Nuprin Voltaren  Bayer Aspirin plain, buffered or children's or timed BC Tablets or powders Encaprin Orgaran Warfarin Sodium  Buff-a-Comp Enoxaparin Orudis Zorpin  Buff-a-Comp with Codeine Equegesic Os-Cal-Gesic   Buffaprin Excedrin plain, buffered or Extra Strength Oxalid   Bufferin Arthritis Strength Feldene Oxphenbutazone   Bufferin plain or Extra Strength Feldene Capsules Oxycodone with Aspirin   Bufferin with Codeine Fenoprofen Fenoprofen Pabalate or Pabalate-SF   Buffets II Flogesic Panagesic   Buffinol plain or Extra Strength Florinal or Florinal with Codeine Panwarfarin   Buf-Tabs Flurbiprofen Penicillamine   Butalbital Compound Four-way cold tablets Penicillin   Butazolidin Fragmin Pepto-Bismol   Carbenicillin Geminisyn Percodan   Carna Arthritis Reliever Geopen Persantine   Carprofen Gold's salt Persistin   Chloramphenicol Goody's Phenylbutazone   Chloromycetin Haltrain Piroxlcam   Clmetidine heparin Plaquenil   Cllnoril Hyco-pap Ponstel   Clofibrate Hydroxy chloroquine Propoxyphen         Before stopping any of these medications, be sure to consult the physician who ordered them.  Some, such as  Coumadin (Warfarin) are ordered to prevent or treat serious conditions such as "deep thrombosis", "pumonary embolisms", and other heart problems.  The amount of time that you may need off of the medication may also vary with the medication and the reason for which you were taking it.  If you are taking any of these medications, please make sure you notify your pain physician before  you undergo any procedures.

## 2016-03-22 NOTE — Progress Notes (Signed)
Subjective:    Patient ID: Theresa Cobb, female    DOB: [birth year], 50 y.o.   MRN: [id number]  HPI  The patient is a 50 year old female who returns to pain management for further evaluation and treatment of pain involving the region of the upper mid lower back and lower extremity regions. The patient denies any recent trauma change in events of daily living the call significant change in symptomatology. The patient is status post surgical intervention of the lumbar region by Dr. Radford Pax of [facility]. The patient has been recommended to continue treatment in pain management center. The patient has pain radiating from the lumbar region for the buttocks on the left as well as on the right which aggravated by standing walking twisting turning maneuvers and climbing stairs.. The patient also admitted to pain involving the region of the left knee with what appeared to be some instability of the left knee according to patient's history we will obtain MRI of the left knee for further evaluation and consider orthopedic evaluation as discussed. We will proceed with block of nerves to the sacroiliac joint at time return appointment as discussed and as explained to patient on today's visit who was with understanding and in agreement with suggested treatment plan  Review of Systems     Objective:   Physical Exam  Was tenderness to palpation of the paraspinal muscular treat the cervical region cervical facet region palpation which reproduces pain of mild degree with mild tenderness of the splenius capitis and occipitalis musculature regions. Palpation of the acromioclavicular and glenohumeral joint regions reproduce mild discomfort as well. There appeared to be unremarkable Spurling's maneuver. Patient was with bilaterally equal grip strength and Tinel and Phalen's maneuver were without increase of pain of significant degree. Palpation over the lumbar paraspinal musculatures and lumbar facet region  associated with moderately severe increased pain with lateral bending rotation extension and palpation of the lumbar facets reproducing moderately severe discomfort on the left as well as on the right. There was severe tenderness over the PSIS and PII S region on the left as well as on the right. There was mild tenderness of the greater trochanteric region iliotibial band region. Straight leg raising was tolerates approximately 20 without a definite increased pain with dorsiflexion noted. There was negative clonus negative Homans. DTRs appeared to be trace at the knees. There was increased pain of moderate degree with pressure applied to the ileum with patient in lateral decubitus position. There was negative Homans negative clonus. Abdomen nontender with no costovertebral tenderness noted      Assessment & Plan:    Sacroiliac joint dysfunction  Degenerative disc disease lumbar spine Multilevel degenerative changes of the lumbar spine L4-L5 prominent and disc bulging, right paracentral disc protrusion, flattening of the lateral recesses, facet hypertrophy and hypertrophy of the ligamentum flavum flavum contributing to findings with L5-S1 and disc bulging and narrowing of the left neural foramen  Lumbar facet syndrome  Lumbar radiculopathy  Greater trochanteric bursitis  Cervicalgia  Bilateral occipital neuralgia  Degenerative joint disease of knee    PLAN   Continue present medications . Neurontin Cymbalta Zanaflex oxycodone Voltaren gel Lidoderm patch and Flector patch.  Block of nerves to the sacroiliac joint to be performed at time of return appointment  F/U PCP Dr. Rebeca Alert for evaliation of  BP and general medical  condition  F/U surgical evaluation. Evaluation with Dr. Radford Pax for neurosurgical reevaluation as discussed and especially if the weakness of the lower extremities continues or  increases.   May consider orthopedic evaluation of left knee pending MRI results of the  left knee  F/U neurological evaluation. May consider [facility] EMG studies and other studies pending follow-up evaluations  May consider radiofrequency rhizolysis or intraspinal procedures pending response to present treatment and F/U evaluation   Ask the nurses and secretary the date of your MRI of the left knee  Patient to call Pain Management Center should patient have concerns prior to scheduled return appointment

## 2016-03-26 ENCOUNTER — Encounter: Payer: Self-pay | Admitting: Pain Medicine

## 2016-03-26 ENCOUNTER — Ambulatory Visit: Payer: Medicaid Other | Attending: Pain Medicine | Admitting: Pain Medicine

## 2016-03-26 VITALS — BP 128/84 | HR 84 | Temp 96.7°F | Resp 20 | Ht 64.0 in | Wt 160.0 lb

## 2016-03-26 DIAGNOSIS — M461 Sacroiliitis, not elsewhere classified: Secondary | ICD-10-CM

## 2016-03-26 DIAGNOSIS — M160 Bilateral primary osteoarthritis of hip: Secondary | ICD-10-CM

## 2016-03-26 DIAGNOSIS — M150 Primary generalized (osteo)arthritis: Secondary | ICD-10-CM

## 2016-03-26 DIAGNOSIS — M5136 Other intervertebral disc degeneration, lumbar region: Secondary | ICD-10-CM | POA: Diagnosis not present

## 2016-03-26 DIAGNOSIS — M5137 Other intervertebral disc degeneration, lumbosacral region: Secondary | ICD-10-CM

## 2016-03-26 DIAGNOSIS — M47816 Spondylosis without myelopathy or radiculopathy, lumbar region: Secondary | ICD-10-CM

## 2016-03-26 DIAGNOSIS — S335XXS Sprain of ligaments of lumbar spine, sequela: Secondary | ICD-10-CM

## 2016-03-26 DIAGNOSIS — M51369 Other intervertebral disc degeneration, lumbar region without mention of lumbar back pain or lower extremity pain: Secondary | ICD-10-CM

## 2016-03-26 DIAGNOSIS — M545 Low back pain: Secondary | ICD-10-CM | POA: Diagnosis present

## 2016-03-26 DIAGNOSIS — M159 Polyosteoarthritis, unspecified: Secondary | ICD-10-CM

## 2016-03-26 DIAGNOSIS — M5416 Radiculopathy, lumbar region: Secondary | ICD-10-CM

## 2016-03-26 DIAGNOSIS — M47818 Spondylosis without myelopathy or radiculopathy, sacral and sacrococcygeal region: Secondary | ICD-10-CM

## 2016-03-26 DIAGNOSIS — M961 Postlaminectomy syndrome, not elsewhere classified: Secondary | ICD-10-CM

## 2016-03-26 DIAGNOSIS — M5126 Other intervertebral disc displacement, lumbar region: Secondary | ICD-10-CM | POA: Insufficient documentation

## 2016-03-26 DIAGNOSIS — M79606 Pain in leg, unspecified: Secondary | ICD-10-CM | POA: Diagnosis present

## 2016-03-26 DIAGNOSIS — M533 Sacrococcygeal disorders, not elsewhere classified: Secondary | ICD-10-CM

## 2016-03-26 DIAGNOSIS — M5481 Occipital neuralgia: Secondary | ICD-10-CM

## 2016-03-26 DIAGNOSIS — M791 Myalgia: Secondary | ICD-10-CM | POA: Diagnosis present

## 2016-03-26 DIAGNOSIS — M51379 Other intervertebral disc degeneration, lumbosacral region without mention of lumbar back pain or lower extremity pain: Secondary | ICD-10-CM

## 2016-03-26 MED ORDER — FENTANYL CITRATE (PF) 100 MCG/2ML IJ SOLN
100.0000 ug | Freq: Once | INTRAMUSCULAR | Status: AC
  Administered 2016-03-26: 100 ug via INTRAVENOUS
  Filled 2016-03-26: qty 2

## 2016-03-26 MED ORDER — BUPIVACAINE HCL (PF) 0.25 % IJ SOLN
30.0000 mL | Freq: Once | INTRAMUSCULAR | Status: AC
Start: 2016-03-26 — End: 2016-03-26
  Administered 2016-03-26: 30 mL
  Filled 2016-03-26: qty 30

## 2016-03-26 MED ORDER — CEFAZOLIN SODIUM 1-5 GM-% IV SOLN
1.0000 g | Freq: Once | INTRAVENOUS | Status: AC
  Administered 2016-03-26: 1 g via INTRAVENOUS

## 2016-03-26 MED ORDER — MIDAZOLAM HCL 5 MG/5ML IJ SOLN
5.0000 mg | Freq: Once | INTRAMUSCULAR | Status: AC
  Administered 2016-03-26: 5 mg via INTRAVENOUS
  Filled 2016-03-26: qty 5

## 2016-03-26 MED ORDER — CEFUROXIME AXETIL 250 MG PO TABS
250.0000 mg | ORAL_TABLET | Freq: Two times a day (BID) | ORAL | Status: DC
Start: 2016-03-26 — End: 2019-12-20

## 2016-03-26 MED ORDER — ORPHENADRINE CITRATE 30 MG/ML IJ SOLN
60.0000 mg | Freq: Once | INTRAMUSCULAR | Status: AC
  Administered 2016-03-26: 60 mg via INTRAMUSCULAR
  Filled 2016-03-26: qty 2

## 2016-03-26 MED ORDER — TRIAMCINOLONE ACETONIDE 40 MG/ML IJ SUSP
40.0000 mg | Freq: Once | INTRAMUSCULAR | Status: AC
  Administered 2016-03-26: 40 mg
  Filled 2016-03-26: qty 1

## 2016-03-26 MED ORDER — LACTATED RINGERS IV SOLN
1000.0000 mL | INTRAVENOUS | Status: AC
  Administered 2016-03-26: 1000 mL via INTRAVENOUS

## 2016-03-26 NOTE — Patient Instructions (Addendum)
PLAN  Continue present medication Neurontin Zanaflex  Cymbalta Flector patch and Voltaren gel Lidoderm patch and oxycodone and begin taking antibiotic Ceftin as prescribed. Please obtain your antibiotic Ceftin today and begin taking antibiotic today  F/U PCP Dr.Bender  for evaliation of  BP and general medical  condition.  F/U surgical evaluation. Follow-up with Dr. Radford Pax as discussed  F/U neurological evaluation. May consider PNCV/EMG studies and other studies as discussed    May consider radiofrequency procedures as well as implantation type procedures pending response to treatment and follow-up evaluation.  Patient to call [facility] should patient have concerns prior to scheduled return appointmentGENERAL RISKS AND COMPLICATIONS  What are the risk, side effects and possible complications? Generally speaking, most procedures are safe.  However, with any procedure there are risks, side effects, and the possibility of complications.  The risks and complications are dependent upon the sites that are lesioned, or the type of nerve block to be performed.  The closer the procedure is to the spine, the more serious the risks are.  Great care is taken when placing the radio frequency needles, block needles or lesioning probes, but sometimes complications can occur. 1. Infection: Any time there is an injection through the skin, there is a risk of infection.  This is why sterile conditions are used for these blocks.  There are four possible types of infection. 1. Localized skin infection. 2. Central Nervous System Infection-This can be in the form of Meningitis, which can be deadly. 3. Epidural Infections-This can be in the form of an epidural abscess, which can cause pressure inside of the spine, causing compression of the spinal cord with subsequent paralysis. This would require an emergency surgery to decompress, and there are no guarantees that the patient would recover from the  paralysis. 4. Discitis-This is an infection of the intervertebral discs.  It occurs in about 1% of discography procedures.  It is difficult to treat and it may lead to surgery.        2. Pain: the needles have to go through skin and soft tissues, will cause soreness.       3. Damage to internal structures:  The nerves to be lesioned may be near blood vessels or    other nerves which can be potentially damaged.       4. Bleeding: Bleeding is more common if the patient is taking blood thinners such as  aspirin, Coumadin, Ticiid, Plavix, etc., or if he/she have some genetic predisposition  such as hemophilia. Bleeding into the spinal canal can cause compression of the spinal  cord with subsequent paralysis.  This would require an emergency surgery to  decompress and there are no guarantees that the patient would recover from the  paralysis.       5. Pneumothorax:  Puncturing of a lung is a possibility, every time a needle is introduced in  the area of the chest or upper back.  Pneumothorax refers to free air around the  collapsed lung(s), inside of the thoracic cavity (chest cavity).  Another two possible  complications related to a similar event would include: Hemothorax and Chylothorax.   These are variations of the Pneumothorax, where instead of air around the collapsed  lung(s), you may have blood or chyle, respectively.       6. Spinal headaches: They may occur with any procedures in the area of the spine.       7. Persistent CSF (Cerebro-Spinal Fluid) leakage: This is a rare problem,  but may occur  with prolonged intrathecal or epidural catheters either due to the formation of a fistulous  track or a dural tear.       8. Nerve damage: By working so close to the spinal cord, there is always a possibility of  nerve damage, which could be as serious as a permanent spinal cord injury with  paralysis.       9. Death:  Although rare, severe deadly allergic reactions known as "Anaphylactic  reaction" can  occur to any of the medications used.      10. Worsening of the symptoms:  We can always make thing worse.  What are the chances of something like this happening? Chances of any of this occuring are extremely low.  By statistics, you have more of a chance of getting killed in a motor vehicle accident: while driving to the hospital than any of the above occurring .  Nevertheless, you should be aware that they are possibilities.  In general, it is similar to taking a shower.  Everybody knows that you can slip, hit your head and get killed.  Does that mean that you should not shower again?  Nevertheless always keep in mind that statistics do not mean anything if you happen to be on the wrong side of them.  Even if a procedure has a 1 (one) in a 1,000,000 (million) chance of going wrong, it you happen to be that one..Also, keep in mind that by statistics, you have more of a chance of having something go wrong when taking medications.  Who should not have this procedure? If you are on a blood thinning medication (e.g. Coumadin, Plavix, see list of "Blood Thinners"), or if you have an active infection going on, you should not have the procedure.  If you are taking any blood thinners, please inform your physician.  How should I prepare for this procedure?  Do not eat or drink anything at least six hours prior to the procedure.  Bring a driver with you .  It cannot be a taxi.  Come accompanied by an adult that can drive you back, and that is strong enough to help you if your legs get weak or numb from the local anesthetic.  Take all of your medicines the morning of the procedure with just enough water to swallow them.  If you have diabetes, make sure that you are scheduled to have your procedure done first thing in the morning, whenever possible.  If you have diabetes, take only half of your insulin dose and notify our nurse that you have done so as soon as you arrive at the clinic.  If you are  diabetic, but only take blood sugar pills (oral hypoglycemic), then do not take them on the morning of your procedure.  You may take them after you have had the procedure.  Do not take aspirin or any aspirin-containing medications, at least eleven (11) days prior to the procedure.  They may prolong bleeding.  Wear loose fitting clothing that may be easy to take off and that you would not mind if it got stained with Betadine or blood.  Do not wear any jewelry or perfume  Remove any nail coloring.  It will interfere with some of our monitoring equipment.  NOTE: Remember that this is not meant to be interpreted as a complete list of all possible complications.  Unforeseen problems may occur.  BLOOD THINNERS The following drugs contain aspirin or other products, which can cause  increased bleeding during surgery and should not be taken for 2 weeks prior to and 1 week after surgery.  If you should need take something for relief of minor pain, you may take acetaminophen which is found in Tylenol,m Datril, Anacin-3 and Panadol. It is not blood thinner. The products listed below are.  Do not take any of the products listed below in addition to any listed on your instruction sheet.  A.P.C or A.P.C with Codeine Codeine Phosphate Capsules #3 Ibuprofen Ridaura  ABC compound Congesprin Imuran rimadil  Advil Cope Indocin Robaxisal  Alka-Seltzer Effervescent Pain Reliever and Antacid Coricidin or Coricidin-D  Indomethacin Rufen  Alka-Seltzer plus Cold Medicine Cosprin Ketoprofen S-A-C Tablets  Anacin Analgesic Tablets or Capsules Coumadin Korlgesic Salflex  Anacin Extra Strength Analgesic tablets or capsules CP-2 Tablets Lanoril Salicylate  Anaprox Cuprimine Capsules Levenox Salocol  Anexsia-D Dalteparin Magan Salsalate  Anodynos Darvon compound Magnesium Salicylate Sine-off  Ansaid Dasin Capsules Magsal Sodium Salicylate  Anturane Depen Capsules Marnal Soma  APF Arthritis pain formula Dewitt's Pills  Measurin Stanback  Argesic Dia-Gesic Meclofenamic Sulfinpyrazone  Arthritis Bayer Timed Release Aspirin Diclofenac Meclomen Sulindac  Arthritis pain formula Anacin Dicumarol Medipren Supac  Analgesic (Safety coated) Arthralgen Diffunasal Mefanamic Suprofen  Arthritis Strength Bufferin Dihydrocodeine Mepro Compound Suprol  Arthropan liquid Dopirydamole Methcarbomol with Aspirin Synalgos  ASA tablets/Enseals Disalcid Micrainin Tagament  Ascriptin Doan's Midol Talwin  Ascriptin A/D Dolene Mobidin Tanderil  Ascriptin Extra Strength Dolobid Moblgesic Ticlid  Ascriptin with Codeine Doloprin or Doloprin with Codeine Momentum Tolectin  Asperbuf Duoprin Mono-gesic Trendar  Aspergum Duradyne Motrin or Motrin IB Triminicin  Aspirin plain, buffered or enteric coated Durasal Myochrisine Trigesic  Aspirin Suppositories Easprin Nalfon Trillsate  Aspirin with Codeine Ecotrin Regular or Extra Strength Naprosyn Uracel  Atromid-S Efficin Naproxen Ursinus  Auranofin Capsules Elmiron Neocylate Vanquish  Axotal Emagrin Norgesic Verin  Azathioprine Empirin or Empirin with Codeine Normiflo Vitamin E  Azolid Emprazil Nuprin Voltaren  Bayer Aspirin plain, buffered or children's or timed BC Tablets or powders Encaprin Orgaran Warfarin Sodium  Buff-a-Comp Enoxaparin Orudis Zorpin  Buff-a-Comp with Codeine Equegesic Os-Cal-Gesic   Buffaprin Excedrin plain, buffered or Extra Strength Oxalid   Bufferin Arthritis Strength Feldene Oxphenbutazone   Bufferin plain or Extra Strength Feldene Capsules Oxycodone with Aspirin   Bufferin with Codeine Fenoprofen Fenoprofen Pabalate or Pabalate-SF   Buffets II Flogesic Panagesic   Buffinol plain or Extra Strength Florinal or Florinal with Codeine Panwarfarin   Buf-Tabs Flurbiprofen Penicillamine   Butalbital Compound Four-way cold tablets Penicillin   Butazolidin Fragmin Pepto-Bismol   Carbenicillin Geminisyn Percodan   Carna Arthritis Reliever Geopen Persantine    Carprofen Gold's salt Persistin   Chloramphenicol Goody's Phenylbutazone   Chloromycetin Haltrain Piroxlcam   Clmetidine heparin Plaquenil   Cllnoril Hyco-pap Ponstel   Clofibrate Hydroxy chloroquine Propoxyphen         Before stopping any of these medications, be sure to consult the physician who ordered them.  Some, such as Coumadin (Warfarin) are ordered to prevent or treat serious conditions such as "deep thrombosis", "pumonary embolisms", and other heart problems.  The amount of time that you may need off of the medication may also vary with the medication and the reason for which you were taking it.  If you are taking any of these medications, please make sure you notify your pain physician before you undergo any procedures.

## 2016-03-26 NOTE — Progress Notes (Signed)
Subjective:    Patient ID: Theresa Cobb, female    DOB: Sep 05, 1966, 50 y.o.   MRN: [id number]  HPI  PROCEDURE:  Block of nerves to the sacroiliac joint.   NOTE:  The patient is a 50 y.o. female who [facility] the [facility] for further evaluation and treatment of pain involving the lower back and lower extremity region with pain in the region of the buttocks as well. Prior MRI studies reveal degenerative disc disease lumbar spine Multilevel degenerative changes of the lumbar spine L4-L5 prominent and disc bulging, right paracentral disc protrusion, flattening of the lateral recesses, facet hypertrophy and hypertrophy of the ligamentum flavum flavum contributing to findings with L5-S1 and disc bulging and narrowing of the left neural foramen.  the patient is with reproduction of severe disabling pain with palpation over the PSIS and PII S regions There is concern regarding a significant component of the patient's pain being due to sacroiliac joint dysfunction The risks, benefits, expectations of the procedure have been discussed and explained to the patient who is understanding and willing to proceed with interventional treatment in attempt to decrease severity of patient's symptoms, minimize the risk of medication escalation and  hopefully retard the progression of the patient's symptoms. We will proceed with what is felt to be a medically necessary procedure, block of nerves to the sacroiliac joint.   DESCRIPTION OF PROCEDURE:  Block of nerves to the sacroiliac joint.   The patient was taken to the fluoroscopy suite. With the patient in the prone position with EKG, blood pressure, pulse, capnography, and pulse oximetry monitoring, IV Versed, IV fentanyl conscious sedation, Betadine prep of proposed entry site was performed.   Block of nerves at the L5 vertebral body level.   With the patient in prone position, under fluoroscopic guidance, a 22 -gauge needle was inserted at the L5  vertebral body level on the left side. With 15 degrees oblique orientation a 22 -gauge needle was inserted in the region known as Burton's eye or eye of the Scotty dog. Following documentation of needle placement in the area of Burton's eye or eye of the Scotty dog under fluoroscopic guidance, needle placement was then accomplished at the sacral ala level on the left side.   Needle placement at the sacral ala.   With the patient in prone position under fluoroscopic guidance with AP view of the lumbosacral spine, a 22 -gauge needle was inserted in the region known as the sacral ala on the left side. Following documentation of needle placement on the left side under fluoroscopic guidance needle placement was then accomplished at the S1 foramen level.   Needle placement at the S1 foramen level.   With the patient in prone position under fluoroscopic guidance with AP view of the lumbosacral spine and cephalad orientation, a 22 -gauge needle was inserted at the superior and lateral border of the S1 foramen on the left side. Following documentation of needle placement at the S1 foramen level on the left side, needle placement was then accomplished at the S2 foramen level on the left side.   Needle placement at the S2 foramen level.   With the patient in prone position with AP view of the lumbosacral spine with cephalad orientation, a 22 - gauge needle was inserted at the superior and lateral border of the S2 foramen under fluoroscopic guidance on the left side. Following needle placement at the L5 vertebral body level, sacral ala, S1 foramen and S2 foramen on the left  side, needle placement was verified on lateral view under fluoroscopic guidance.  Following needle placement documentation on lateral view, each needle was injected with 1 mL of 0.25% bupivacaine and Kenalog.   BLOCK OF THE NERVES TO SACROILIAC JOINT ON THE RIGHT SIDE The procedure was performed on the right side at the same levels as was  performed on the left side and utilizing the same technique as on the left side and was performed under fluoroscopic guidance as on the left side  Myoneural block injections of the gluteal musculature region Following Betadine prep of proposed entry site a 22-gauge needle was inserted into the gluteal musculature region and following negative aspiration 2 cc of 0.25% bupivacaine with Norflex was injected for myoneural block injection of the gluteal musculature region times to   A total of 10mg  of Kenalog was utilized for the procedure.   PLAN:  1. Medications: The patient will continue presently prescribed medications Neurontin Zanaflex Cymbalta Flector patch Voltaren gel Lidoderm patch and oxycodone 2. The patient will be considered for modification of treatment regimen pending response to the procedure performed on today's visit.  3. The patient is to follow-up with primary care physician Dr.A Rebeca Alert for evaluation of blood pressure and general medical condition following the procedure performed on today's visit.  4. Surgical evaluation as discussed.. Patient will follow-up with Dr. Radford Pax as discussed  5. Neurological evaluation as discussed. May consider PNCV EMG studies and other studies 6. The patient may be a candidate for radiofrequency procedures, implantation devices and other treatment pending response to treatment performed on today's visit and follow-up evaluation.  7. The patient has been advised to adhere to proper body mechanics and to avoid activities which may exacerbate the patient's symptoms.   Return appointment to [facility] as scheduled.    Review of Systems     Objective:   Physical Exam        Assessment & Plan:

## 2016-03-26 NOTE — Progress Notes (Signed)
Patient here for procedure d/t lower back pain. Safety precautions to be maintained throughout the outpatient stay will include: orient to surroundings, keep bed in low position, maintain call bell within reach at all times, provide assistance with transfer out of bed and ambulation.  

## 2016-04-24 ENCOUNTER — Encounter: Payer: Self-pay | Admitting: Pain Medicine

## 2016-04-24 ENCOUNTER — Ambulatory Visit: Payer: Medicaid Other | Attending: Pain Medicine | Admitting: Pain Medicine

## 2016-04-24 VITALS — BP 115/86 | HR 90 | Temp 98.4°F | Resp 18 | Ht 64.0 in | Wt 160.0 lb

## 2016-04-24 DIAGNOSIS — M7060 Trochanteric bursitis, unspecified hip: Secondary | ICD-10-CM | POA: Diagnosis not present

## 2016-04-24 DIAGNOSIS — M542 Cervicalgia: Secondary | ICD-10-CM | POA: Diagnosis not present

## 2016-04-24 DIAGNOSIS — M5481 Occipital neuralgia: Secondary | ICD-10-CM | POA: Diagnosis not present

## 2016-04-24 DIAGNOSIS — M5126 Other intervertebral disc displacement, lumbar region: Secondary | ICD-10-CM | POA: Insufficient documentation

## 2016-04-24 DIAGNOSIS — M79604 Pain in right leg: Secondary | ICD-10-CM | POA: Diagnosis present

## 2016-04-24 DIAGNOSIS — M51379 Other intervertebral disc degeneration, lumbosacral region without mention of lumbar back pain or lower extremity pain: Secondary | ICD-10-CM

## 2016-04-24 DIAGNOSIS — M5116 Intervertebral disc disorders with radiculopathy, lumbar region: Secondary | ICD-10-CM | POA: Insufficient documentation

## 2016-04-24 DIAGNOSIS — M159 Polyosteoarthritis, unspecified: Secondary | ICD-10-CM

## 2016-04-24 DIAGNOSIS — M47896 Other spondylosis, lumbar region: Secondary | ICD-10-CM | POA: Diagnosis not present

## 2016-04-24 DIAGNOSIS — M79605 Pain in left leg: Secondary | ICD-10-CM | POA: Diagnosis present

## 2016-04-24 DIAGNOSIS — M545 Low back pain: Secondary | ICD-10-CM | POA: Diagnosis present

## 2016-04-24 DIAGNOSIS — S335XXS Sprain of ligaments of lumbar spine, sequela: Secondary | ICD-10-CM

## 2016-04-24 DIAGNOSIS — M533 Sacrococcygeal disorders, not elsewhere classified: Secondary | ICD-10-CM

## 2016-04-24 DIAGNOSIS — M5127 Other intervertebral disc displacement, lumbosacral region: Secondary | ICD-10-CM | POA: Insufficient documentation

## 2016-04-24 DIAGNOSIS — M150 Primary generalized (osteo)arthritis: Secondary | ICD-10-CM

## 2016-04-24 DIAGNOSIS — M961 Postlaminectomy syndrome, not elsewhere classified: Secondary | ICD-10-CM

## 2016-04-24 DIAGNOSIS — M5416 Radiculopathy, lumbar region: Secondary | ICD-10-CM

## 2016-04-24 DIAGNOSIS — M51369 Other intervertebral disc degeneration, lumbar region without mention of lumbar back pain or lower extremity pain: Secondary | ICD-10-CM

## 2016-04-24 DIAGNOSIS — M47816 Spondylosis without myelopathy or radiculopathy, lumbar region: Secondary | ICD-10-CM

## 2016-04-24 DIAGNOSIS — M160 Bilateral primary osteoarthritis of hip: Secondary | ICD-10-CM

## 2016-04-24 DIAGNOSIS — M5137 Other intervertebral disc degeneration, lumbosacral region: Secondary | ICD-10-CM

## 2016-04-24 DIAGNOSIS — M5136 Other intervertebral disc degeneration, lumbar region: Secondary | ICD-10-CM

## 2016-04-24 DIAGNOSIS — M461 Sacroiliitis, not elsewhere classified: Secondary | ICD-10-CM

## 2016-04-24 DIAGNOSIS — M47818 Spondylosis without myelopathy or radiculopathy, sacral and sacrococcygeal region: Secondary | ICD-10-CM

## 2016-04-24 MED ORDER — DICLOFENAC SODIUM 1 % TD GEL: TRANSDERMAL | Status: DC

## 2016-04-24 MED ORDER — GABAPENTIN 400 MG PO CAPS: ORAL_CAPSULE | ORAL | Status: DC

## 2016-04-24 MED ORDER — DICLOFENAC EPOLAMINE 1.3 % TD PTCH: MEDICATED_PATCH | TRANSDERMAL | Status: DC

## 2016-04-24 MED ORDER — OXYCODONE HCL 5 MG PO CAPS: ORAL_CAPSULE | ORAL | Status: DC

## 2016-04-24 MED ORDER — DULOXETINE HCL 30 MG PO CPEP: ORAL_CAPSULE | ORAL | Status: DC

## 2016-04-24 MED ORDER — TIZANIDINE HCL 2 MG PO CAPS
ORAL_CAPSULE | ORAL | Status: DC
Start: 2016-04-24 — End: 2016-07-18

## 2016-04-24 NOTE — Progress Notes (Signed)
Safety precautions to be maintained throughout the outpatient stay will include: orient to surroundings, keep bed in low position, maintain call bell within reach at all times, provide assistance with transfer out of bed and ambulation.  

## 2016-04-24 NOTE — Patient Instructions (Addendum)
PLAN   Continue present medication Neurontin Zanaflex  Cymbalta Flector patch and Voltaren gel Lidoderm patch and oxycodone and begin taking antibiotic Ceftin as prescribed. Please obtain your antibiotic Ceftin today and begin taking antibiotic today  Lumbar facet, medial branch nerve blocks to be performed at time return appointment  F/U PCP Dr.Bender  for evaliation of  BP and general medical  condition.  F/U surgical evaluation. Follow-up with Dr. Radford Pax as discussed  F/U neurological evaluation. May consider PNCV/EMG studies and other studies as discussed    Ask the nurses and secretary at the date of your MRI of the left knee  May consider radiofrequency procedures as well as implantation type procedures pending response to treatment and follow-up evaluation.  Patient to call [facility] should patient have concerns prior to scheduled return appointmentFacet Blocks Patient Information  Description: The facets are joints in the spine between the vertebrae.  Like any joints in the body, facets can become irritated and painful.  Arthritis can also effect the facets.  By injecting steroids and local anesthetic in and around these joints, we can temporarily block the nerve supply to them.  Steroids act directly on irritated nerves and tissues to reduce selling and inflammation which often leads to decreased pain.  Facet blocks may be done anywhere along the spine from the neck to the low back depending upon the location of your pain.   After numbing the skin with local anesthetic (like Novocaine), a small needle is passed onto the facet joints under x-ray guidance.  You may experience a sensation of pressure while this is being done.  The entire block usually lasts about 15-25 minutes.   Conditions which may be treated by facet blocks:   Low back/buttock pain  Neck/shoulder pain  Certain types of headaches  Preparation for the injection:  1. Do not eat any solid food or  dairy products within 8 hours of your appointment. 2. You may drink clear liquid up to 3 hours before appointment.  Clear liquids include water, black coffee, juice or soda.  No milk or cream please. 3. You may take your regular medication, including pain medications, with a sip of water before your appointment.  Diabetics should hold regular insulin (if taken separately) and take 1/2 normal NPH dose the morning of the procedure.  Carry some sugar containing items with you to your appointment. 4. A driver must accompany you and be prepared to drive you home after your procedure. 5. Bring all your current medications with you. 6. An IV may be inserted and sedation may be given at the discretion of the physician. 7. A blood pressure cuff, EKG and other monitors will often be applied during the procedure.  Some patients may need to have extra oxygen administered for a short period. 8. You will be asked to provide medical information, including your allergies and medications, prior to the procedure.  We must know immediately if you are taking blood thinners (like Coumadin/Warfarin) or if you are allergic to IV iodine contrast (dye).  We must know if you could possible be pregnant.  Possible side-effects:   Bleeding from needle site  Infection (rare, may require surgery)  Nerve injury (rare)  Numbness & tingling (temporary)  Difficulty urinating (rare, temporary)  Spinal headache (a headache worse with upright posture)  Light-headedness (temporary)  Pain at injection site (serveral days)  Decreased blood pressure (rare, temporary)  Weakness in arm/leg (temporary)  Pressure sensation in back/neck (temporary)   Call if  you experience:   Fever/chills associated with headache or increased back/neck pain  Headache worsened by an upright position  New onset, weakness or numbness of an extremity below the injection site  Hives or difficulty breathing (go to the emergency  room)  Inflammation or drainage at the injection site(s)  Severe back/neck pain greater than usual  New symptoms which are concerning to you  Please note:  Although the local anesthetic injected can often make your back or neck feel good for several hours after the injection, the pain will likely return. It takes 3-7 days for steroids to work.  You may not notice any pain relief for at least one week.  If effective, we will often do a series of 2-3 injections spaced 3-6 weeks apart to maximally decrease your pain.  After the initial series, you may be a candidate for a more permanent nerve block of the facets.  If you have any questions, please call #[phone]) [facility]

## 2016-04-24 NOTE — Progress Notes (Signed)
   Subjective:    Patient ID: Theresa Cobb, female    DOB: 12-29-1965, 50 y.o.   MRN: [id number]  HPI  The patient is a 50 year old female who returns to pain management for further evaluation and treatment of pain involving the lower back and lower extremity regions. The patient is status post surgical intervention of the lumbar region by Dr. Radford Pax of [place]. The patient has pain aggravated by twisting turning maneuvers and becoming more intense as patient stands and walks. We have discussed interventional treatment and will consider patient for interventional treatment at time of return appointment. We will consider performing lumbar facet, medial branch nerve blocks and will also request insurance approval for radiofrequency rhizolysis lumbar facet medial branch nerves to be performed after patient has her block of the medial branch nerves which have been quite successful in the past in terms of relieving patient's pain. The patient will continue medications Neurontin Zanaflex Cymbalta oxycodone Flector patch for Voltaren gel and Lidoderm patch. All agreed to suggested treatment plan   Review of Systems     Objective:   Physical Exam   Palpation of the splenius capitis and occipitalis muscles reproduce mild discomfort with mild tenderness over the cervical facet cervical paraspinal musculature region. There was mild tenderness of the acromioclavicular and glenohumeral joint regions. The patient appeared to be with unremarkable Spurling's maneuver and was with bilaterally equal grip strength with Tinel and Phalen's maneuver reproducing minimal discomfort. Palpation over the thoracic region thoracic facet region was attends to palpation of moderate degree in the lower thoracic region with no crepitus of the thoracic region noted. Palpation over the PSIS and PII S regions reproduce severe pain with severe increased pain with palpation over the lumbar facet lumbar paraspinal musculature  region as well. Straight leg raising was tolerates approximately 30 without a definite increase of pain with dorsiflexion noted. There appeared to be negative clonus negative Homans. DTRs were trace at the knees. No definite sensory deficit or dermatomal distribution detected. The predominant portion of patient's pain was reproduced with lateral bending rotation extension and palpation of the lumbar facet region. The abdomen was nontender and no costovertebral tenderness was noted.     Assessment & Plan:     Degenerative disc disease lumbar spine Multilevel degenerative changes of the lumbar spine L4-L5 prominent and disc bulging, right paracentral disc protrusion, flattening of the lateral recesses, facet hypertrophy and hypertrophy of the ligamentum flavum flavum contributing to findings with L5-S1 and disc bulging and narrowing of the left neural foramen  Lumbar facet syndrome  Lumbar radiculopathy  Greater trochanteric bursitis  Cervicalgia  Bilateral occipital neuralgia       PLAN   Continue present medication Neurontin Zanaflex  Cymbalta Flector patch and Voltaren gel Lidoderm patch and oxycodone  Lumbar facet, medial branch nerve blocks to be performed at time return appointment  F/U PCP Dr.Bender  for evaliation of  BP and general medical  condition.  F/U surgical evaluation. Follow-up with Dr. Radford Pax as discussed. Patient will discuss further neurosurgical evaluation with primary care physician Dr. Rebeca Alert  F/U neurological evaluation. May consider PNCV/EMG studies and other studies as discussed    Ask the nurses and secretary at the date of your MRI of the left knee  May consider radiofrequency procedures as well as implantation type procedures pending response to treatment and follow-up evaluation.  Patient to call Pain Management Center should patient have concerns prior to scheduled return appointment

## 2016-05-02 LAB — TOXASSURE SELECT 13 (MW), URINE

## 2016-05-02 NOTE — Progress Notes (Signed)
Quick Note:  Reviewed. ______ 

## 2016-05-07 ENCOUNTER — Ambulatory Visit: Payer: Medicaid Other | Attending: Pain Medicine | Admitting: Pain Medicine

## 2016-05-07 ENCOUNTER — Encounter: Payer: Self-pay | Admitting: Pain Medicine

## 2016-05-07 VITALS — BP 101/73 | HR 110 | Temp 98.1°F | Resp 18 | Ht 64.0 in | Wt 157.0 lb

## 2016-05-07 DIAGNOSIS — M533 Sacrococcygeal disorders, not elsewhere classified: Secondary | ICD-10-CM

## 2016-05-07 DIAGNOSIS — M47818 Spondylosis without myelopathy or radiculopathy, sacral and sacrococcygeal region: Secondary | ICD-10-CM

## 2016-05-07 DIAGNOSIS — M5136 Other intervertebral disc degeneration, lumbar region: Secondary | ICD-10-CM | POA: Diagnosis not present

## 2016-05-07 DIAGNOSIS — M961 Postlaminectomy syndrome, not elsewhere classified: Secondary | ICD-10-CM

## 2016-05-07 DIAGNOSIS — M545 Low back pain: Secondary | ICD-10-CM | POA: Diagnosis present

## 2016-05-07 DIAGNOSIS — M47816 Spondylosis without myelopathy or radiculopathy, lumbar region: Secondary | ICD-10-CM | POA: Diagnosis not present

## 2016-05-07 DIAGNOSIS — M5481 Occipital neuralgia: Secondary | ICD-10-CM

## 2016-05-07 DIAGNOSIS — S335XXS Sprain of ligaments of lumbar spine, sequela: Secondary | ICD-10-CM

## 2016-05-07 DIAGNOSIS — M5126 Other intervertebral disc displacement, lumbar region: Secondary | ICD-10-CM | POA: Diagnosis not present

## 2016-05-07 DIAGNOSIS — M5416 Radiculopathy, lumbar region: Secondary | ICD-10-CM

## 2016-05-07 DIAGNOSIS — M79606 Pain in leg, unspecified: Secondary | ICD-10-CM | POA: Diagnosis present

## 2016-05-07 DIAGNOSIS — M159 Polyosteoarthritis, unspecified: Secondary | ICD-10-CM

## 2016-05-07 DIAGNOSIS — M5137 Other intervertebral disc degeneration, lumbosacral region: Secondary | ICD-10-CM

## 2016-05-07 DIAGNOSIS — M51379 Other intervertebral disc degeneration, lumbosacral region without mention of lumbar back pain or lower extremity pain: Secondary | ICD-10-CM

## 2016-05-07 DIAGNOSIS — M51369 Other intervertebral disc degeneration, lumbar region without mention of lumbar back pain or lower extremity pain: Secondary | ICD-10-CM

## 2016-05-07 DIAGNOSIS — M461 Sacroiliitis, not elsewhere classified: Secondary | ICD-10-CM

## 2016-05-07 DIAGNOSIS — M150 Primary generalized (osteo)arthritis: Secondary | ICD-10-CM

## 2016-05-07 DIAGNOSIS — M160 Bilateral primary osteoarthritis of hip: Secondary | ICD-10-CM

## 2016-05-07 MED ORDER — CEFAZOLIN IN D5W 1 GM/50ML IV SOLN
1.0000 g | Freq: Once | INTRAVENOUS | Status: AC
  Administered 2016-05-07: 1 g via INTRAVENOUS

## 2016-05-07 MED ORDER — MIDAZOLAM HCL 5 MG/5ML IJ SOLN
5.0000 mg | Freq: Once | INTRAMUSCULAR | Status: AC
  Administered 2016-05-07: 4 mg via INTRAVENOUS
  Filled 2016-05-07: qty 5

## 2016-05-07 MED ORDER — LACTATED RINGERS IV SOLN: 1000.0000 mL | INTRAVENOUS | Status: AC

## 2016-05-07 MED ORDER — CEFUROXIME AXETIL 250 MG PO TABS: 250.0000 mg | ORAL_TABLET | Freq: Two times a day (BID) | ORAL | Status: DC

## 2016-05-07 MED ORDER — TRIAMCINOLONE ACETONIDE 40 MG/ML IJ SUSP
40.0000 mg | Freq: Once | INTRAMUSCULAR | Status: AC
  Administered 2016-05-07: 40 mg
  Filled 2016-05-07: qty 1

## 2016-05-07 MED ORDER — CEFAZOLIN SODIUM 1 G IJ SOLR
INTRAMUSCULAR | Status: AC
  Administered 2016-05-07: 10:00:00
  Filled 2016-05-07: qty 10

## 2016-05-07 MED ORDER — FENTANYL CITRATE (PF) 100 MCG/2ML IJ SOLN
100.0000 ug | Freq: Once | INTRAMUSCULAR | Status: AC
  Administered 2016-05-07: 100 ug via INTRAVENOUS
  Filled 2016-05-07: qty 2

## 2016-05-07 MED ORDER — ORPHENADRINE CITRATE 30 MG/ML IJ SOLN
60.0000 mg | Freq: Once | INTRAMUSCULAR | Status: AC
  Administered 2016-05-07: 60 mg via INTRAMUSCULAR
  Filled 2016-05-07: qty 2

## 2016-05-07 MED ORDER — BUPIVACAINE HCL (PF) 0.25 % IJ SOLN
30.0000 mL | Freq: Once | INTRAMUSCULAR | Status: AC
  Administered 2016-05-07: 30 mL
  Filled 2016-05-07: qty 30

## 2016-05-07 NOTE — Patient Instructions (Addendum)
PLAN  Continue present medication Neurontin Zanaflex  Cymbalta Flector patch and Voltaren gel Lidoderm patch and oxycodone and   Begin taking antibiotic Ceftin antibiotic as prescribed. Please obtain your antibiotic Ceftin today and begin taking antibiotic today  F/U PCP Dr.Bender  for evaliation of  BP and general medical  condition.  F/U surgical evaluation. Follow-up with Dr. Radford Pax as discussed  F/U neurological evaluation. May consider PNCV/EMG studies and other studies as discussed    May consider radiofrequency procedures as well as implantation type procedures pending response to treatment and follow-up evaluation.  Patient to call Pain Management Center should patient have concerns prior to scheduled return appointmentPain Management Discharge Instructions  General Discharge Instructions :  If you need to reach your doctor call: Monday-Friday 8:00 am - 4:00 pm at [phone] or toll free [phone].  After clinic hours [phone] to have operator reach doctor.  Bring all of your medication bottles to all your appointments in the pain clinic.  To cancel or reschedule your appointment with Pain Management please remember to call 24 hours in advance to avoid a fee.  Refer to the educational materials which you have been given on: General Risks, I had my Procedure. Discharge Instructions, Post Sedation.  Post Procedure Instructions:  The drugs you were given will stay in your system until tomorrow, so for the next 24 hours you should not drive, make any legal decisions or drink any alcoholic beverages.  You may eat anything you prefer, but it is better to start with liquids then soups and crackers, and gradually work up to solid foods.  Please notify your doctor immediately if you have any unusual bleeding, trouble breathing or pain that is not related to your normal pain.  Depending on the type of procedure that was done, some parts of your body may feel week and/or  numb.  This usually clears up by tonight or the next day.  Walk with the use of an assistive device or accompanied by an adult for the 24 hours.  You may use ice on the affected area for the first 24 hours.  Put ice in a Ziploc bag and cover with a towel and place against area 15 minutes on 15 minutes off.  You may switch to heat after 24 hours.

## 2016-05-07 NOTE — Progress Notes (Signed)
Patient here for procedure for lower back pain on right. Safety precautions to be maintained throughout the outpatient stay will include: orient to surroundings, keep bed in low position, maintain call bell within reach at all times, provide assistance with transfer out of bed and ambulation.

## 2016-05-07 NOTE — Progress Notes (Signed)
Subjective:    Patient ID: Theresa Cobb, female    DOB: June 23, 1966, 50 y.o.   MRN: [id number]  HPI  PROCEDURE PERFORMED: Lumbar facet (medial branch block)   NOTE: The patient is a 50 y.o. female who returns to [facility] for further evaluation and treatment of pain involving the lumbar and lower extremity region. MRI  revealed the patient to be with evidence of degenerative disc disease lumbar spine withj multilevel degenerative changes of the lumbar spine L4-L5 prominent and disc bulging, right paracentral disc protrusion, flattening of the lateral recesses, facet hypertrophy and hypertrophy of the ligamentum flavum flavum contributing to findings with L5-S1 and disc bulging and narrowing of the left neural foramen. There is concern regarding significant component of patient's pain being due to lumbar facet syndrome The risks, benefits, and expectations of the procedure have been discussed and explained to the patient who was understanding and in agreement with suggested treatment plan. We will proceed with interventional treatment as discussed and as explained to the patient who was understanding and wished to proceed with procedure as planned.   DESCRIPTION OF PROCEDURE: Lumbar facet (medial branch block) with IV Versed, IV fentanyl conscious sedation, EKG, blood pressure, pulse, capnography, and pulse oximetry monitoring. The procedure was performed with the patient in the prone position. Betadine prep of proposed entry site performed.   NEEDLE PLACEMENT AT: Left L 2 lumbar facet (medial branch block). Under fluoroscopic guidance with oblique orientation of 15 degrees, a 22-gauge needle was inserted at the L 2  vertebral body level with needle placed at the targeted area of Burton's Eye or Eye of the Scotty Dog with documentation of needle placement in the superior and lateral border of targeted area of Burton's Eye or Eye of the Scotty Dog with oblique orientation of 15 degrees.  Following documentation of needle placement at the L 2 vertebral body level, needle placement was then accomplished at the L 3 vertebral body level.   NEEDLE PLACEMENT AT L3 and L4 VERTEBRAL BODY LEVELS ON THE LEFT SIDE The procedure was performed at the L3 and L4 vertebral body levels exactly as was performed at the L 2 vertebral body level utilizing the same technique and under fluoroscopic guidance.  NEEDLE PLACEMENT AT THE SACRAL ALA with AP view of the lumbosacral spine. With the patient in the prone position, Betadine prep of proposed entry site accomplished, a 22 gauge needle was inserted in the region of the sacral ala (groove formed by the superior articulating process of S1 and the sacral wing). Following documentation of needle placement at the sacral ala,   NEEDLE PLACEMENT AT THE S1 FORAMEN LEVEL under fluoroscopic guidance with AP view of the lumbosacral spine and cephalad orientation of the fluoroscope, a 22-gauge needle was placed at the superior and lateral border of the S1 foramen under fluoroscopic guidance. Following documentation of needle placement at the S1 foramen.   Needle placement was then verified at all levels on lateral view. Following documentation of needle placement at all levels on lateral view and following negative aspiration for heme and CSF, each level was injected with 1 mL of 0.25% bupivacaine with Kenalog.     LUMBAR FACET, MEDIAL BRANCH NERVE, BLOCKS PERFORMED ON THE RIGHT SIDE   The procedure was performed on the right side exactly as was performed on the left side at the same levels and utilizing the same technique under fluoroscopic guidance.     The patient tolerated the procedure well. A total of  40 mg of Kenalog was utilized for the procedure.   PLAN:  1. Medications: The patient will continue presently prescribed medications. Neurontin Zanaflex Cymbalta Flector patch Voltaren Gel Lidoderm patch and oxycodone 2. May consider modification of  treatment regimen at time of return appointment pending response to treatment rendered on today's visit. 3. The patient is to follow-up with primary care physician Dr. Rebeca Alert for further evaluation of blood pressure and general medical condition status post steroid injection performed on today's visit. 4. Surgical follow-up evaluation. Patient will follow-up with Dr. Radford Pax as discussed 5. Neurological follow-up evaluation. May consider PNCV/EMG studies and other studies 6. The patient may be candidate for radiofrequency procedures, implantation type procedures, and other treatment pending response to treatment and follow-up evaluation. 7. The patient has been advised to call the Pain Management Center prior to scheduled return appointment should there be significant change in condition or should patient have other concerns regarding condition prior to scheduled return appointment.  The patient is understanding and in agreement with suggested treatment plan.   Review of Systems     Objective:   Physical Exam        Assessment & Plan:

## 2016-05-08 ENCOUNTER — Telehealth: Payer: Self-pay | Admitting: *Deleted

## 2016-05-08 NOTE — Telephone Encounter (Signed)
Spoke with patient, denies any questions or concerns from procedure on yesterday.

## 2016-05-09 ENCOUNTER — Ambulatory Visit: Payer: Medicaid Other | Admitting: Pain Medicine

## 2016-05-22 ENCOUNTER — Encounter: Payer: Self-pay | Admitting: Pain Medicine

## 2016-05-22 ENCOUNTER — Ambulatory Visit: Payer: Medicaid Other | Attending: Pain Medicine | Admitting: Pain Medicine

## 2016-05-22 VITALS — BP 117/83 | HR 101 | Temp 99.0°F | Resp 16 | Ht 64.0 in | Wt 160.0 lb

## 2016-05-22 DIAGNOSIS — M5137 Other intervertebral disc degeneration, lumbosacral region: Secondary | ICD-10-CM

## 2016-05-22 DIAGNOSIS — M47816 Spondylosis without myelopathy or radiculopathy, lumbar region: Secondary | ICD-10-CM

## 2016-05-22 DIAGNOSIS — M533 Sacrococcygeal disorders, not elsewhere classified: Secondary | ICD-10-CM

## 2016-05-22 DIAGNOSIS — M5481 Occipital neuralgia: Secondary | ICD-10-CM | POA: Insufficient documentation

## 2016-05-22 DIAGNOSIS — M5136 Other intervertebral disc degeneration, lumbar region: Secondary | ICD-10-CM

## 2016-05-22 DIAGNOSIS — M47818 Spondylosis without myelopathy or radiculopathy, sacral and sacrococcygeal region: Secondary | ICD-10-CM

## 2016-05-22 DIAGNOSIS — M7060 Trochanteric bursitis, unspecified hip: Secondary | ICD-10-CM | POA: Diagnosis not present

## 2016-05-22 DIAGNOSIS — M5127 Other intervertebral disc displacement, lumbosacral region: Secondary | ICD-10-CM | POA: Insufficient documentation

## 2016-05-22 DIAGNOSIS — S335XXS Sprain of ligaments of lumbar spine, sequela: Secondary | ICD-10-CM

## 2016-05-22 DIAGNOSIS — M542 Cervicalgia: Secondary | ICD-10-CM | POA: Insufficient documentation

## 2016-05-22 DIAGNOSIS — M160 Bilateral primary osteoarthritis of hip: Secondary | ICD-10-CM

## 2016-05-22 DIAGNOSIS — M461 Sacroiliitis, not elsewhere classified: Secondary | ICD-10-CM

## 2016-05-22 DIAGNOSIS — M5116 Intervertebral disc disorders with radiculopathy, lumbar region: Secondary | ICD-10-CM | POA: Diagnosis not present

## 2016-05-22 DIAGNOSIS — M545 Low back pain: Secondary | ICD-10-CM | POA: Diagnosis present

## 2016-05-22 DIAGNOSIS — M150 Primary generalized (osteo)arthritis: Secondary | ICD-10-CM

## 2016-05-22 DIAGNOSIS — M159 Polyosteoarthritis, unspecified: Secondary | ICD-10-CM

## 2016-05-22 DIAGNOSIS — M961 Postlaminectomy syndrome, not elsewhere classified: Secondary | ICD-10-CM

## 2016-05-22 DIAGNOSIS — M5416 Radiculopathy, lumbar region: Secondary | ICD-10-CM

## 2016-05-22 DIAGNOSIS — M5126 Other intervertebral disc displacement, lumbar region: Secondary | ICD-10-CM | POA: Diagnosis not present

## 2016-05-22 MED ORDER — OXYCODONE HCL 5 MG PO CAPS: ORAL_CAPSULE | ORAL | Status: DC

## 2016-05-22 NOTE — Progress Notes (Signed)
Patient here for medication management and post procedure evaluation. Safety precautions to be maintained throughout the outpatient stay will include: orient to surroundings, keep bed in low position, maintain call bell within reach at all times, provide assistance with transfer out of bed and ambulation.

## 2016-05-22 NOTE — Progress Notes (Signed)
   Subjective:    Patient ID: Theresa Cobb, female    DOB: January 31, 1966, 50 y.o.   MRN: [id number]  HPI  The patient is a 50 year old female who returns to pain management for further evaluation and treatment of pain involving the lower back and lower extremity region predominantly. The patient is undergone surgical intervention of the lumbar region by Dr. Radford Pax at [facility]. The patient has been recommended to continue treatment in pain management at this time. The patient is status post lumbar facet, medial branch nerve blocks with significant improvement of lower back and lower extremity pain. We will consider patient for radiofrequency rhizolysis as discussed. At the present time we will continue presently prescribed medications consisting of Neurontin Zanaflex Cymbalta Flector patch and all agreed to suggested treatment plan Voltaren Gel as well as Lidoderm patches.    Review of Systems     Objective:   Physical Exam  There was tenderness of the splenius capitis and occipitalis region palpation which be produced pain of mild degree with mild tenderness of the cervical facet cervical paraspinal musculature region. Palpation of the acromioclavicular and glenohumeral joint regions reproduces minimal discomfort and patient appeared to be with unremarkable Spurling's maneuver and was able to perform drop test without difficulty. Palpation over the region of the thoracic region was attends to palpation of moderate degree of the lower thoracic region with no crepitus of the thoracic region noted. There was tenderness over the lumbar region lumbar paraspinal musculature region right greater than the left with lateral bending rotation extension and palpation of the lumbar facets reproducing moderate discomfort. There was tenderness of the PSIS and PII S region on the right greater than the left with mild tenderness of the greater trochanteric region iliotibial band region. There was moderate  tenderness to palpation of the left knee in crepitus of the left knee with negative anterior and posterior drawer signs. There was increased pain with range of motion maneuvers of the left knee Straight leg raising was tolerates approximately 20 without increase of pain with dorsiflexion noted. Patient was with minor difficulty attending to stand on tiptoes and heels. No definite sensory deficit of dermatomal distribution was detected and there was negative clonus negative Homans. Abdomen was nontender with no costovertebral tenderness noted      Assessment & Plan:     Degenerative disc disease lumbar spine Multilevel degenerative changes of the lumbar spine L4-L5 prominent and disc bulging, right paracentral disc protrusion, flattening of the lateral recesses, facet hypertrophy and hypertrophy of the ligamentum flavum flavum contributing to findings with L5-S1 and disc bulging and narrowing of the left neural foramen  Lumbar facet syndrome  Lumbar radiculopathy  Greater trochanteric bursitis  Cervicalgia  Bilateral occipital neuralgia      PLAN   Continue present medication Neurontin Zanaflex  Cymbalta Flector patch and Voltaren gel Lidoderm patch and oxycodone  F/U PCP Dr.Bender  for evaliation of  BP and general medical  condition.  F/U surgical evaluation. Follow-up with Dr. Radford Pax as discussed  F/U neurological evaluation. May consider PNCV/EMG studies and other studies as discussed    Ask the nurses and secretary at the date of your MRI of the left knee  May consider radiofrequency procedures as well as implantation type procedures pending response to treatment and follow-up evaluation.  Patient to call Pain Management Center should patient have concerns prior to scheduled return appointment

## 2016-05-22 NOTE — Patient Instructions (Signed)
  PLAN   Continue present medication Neurontin Zanaflex  Cymbalta Flector patch and Voltaren gel Lidoderm patch and oxycodone  F/U PCP Dr.Bender  for evaliation of  BP and general medical  condition.  F/U surgical evaluation. Follow-up with Dr. Radford Pax as discussed  F/U neurological evaluation. May consider PNCV/EMG studies and other studies as discussed    Ask the nurses and secretary at the date of your MRI of the left knee  May consider radiofrequency procedures as well as implantation type procedures pending response to treatment and follow-up evaluation.  Patient to call Pain Management Center should patient have concerns prior to scheduled return appointment

## 2016-06-21 ENCOUNTER — Ambulatory Visit: Payer: Medicaid Other | Attending: Pain Medicine | Admitting: Pain Medicine

## 2016-06-21 ENCOUNTER — Encounter: Payer: Self-pay | Admitting: Pain Medicine

## 2016-06-21 VITALS — BP 111/66 | HR 102 | Temp 98.4°F | Resp 16 | Ht 64.0 in | Wt 160.0 lb

## 2016-06-21 DIAGNOSIS — M542 Cervicalgia: Secondary | ICD-10-CM | POA: Diagnosis not present

## 2016-06-21 DIAGNOSIS — M150 Primary generalized (osteo)arthritis: Secondary | ICD-10-CM

## 2016-06-21 DIAGNOSIS — M5126 Other intervertebral disc displacement, lumbar region: Secondary | ICD-10-CM | POA: Diagnosis not present

## 2016-06-21 DIAGNOSIS — M5481 Occipital neuralgia: Secondary | ICD-10-CM

## 2016-06-21 DIAGNOSIS — M79604 Pain in right leg: Secondary | ICD-10-CM | POA: Diagnosis present

## 2016-06-21 DIAGNOSIS — M5116 Intervertebral disc disorders with radiculopathy, lumbar region: Secondary | ICD-10-CM | POA: Diagnosis not present

## 2016-06-21 DIAGNOSIS — M47896 Other spondylosis, lumbar region: Secondary | ICD-10-CM | POA: Diagnosis not present

## 2016-06-21 DIAGNOSIS — M5127 Other intervertebral disc displacement, lumbosacral region: Secondary | ICD-10-CM | POA: Insufficient documentation

## 2016-06-21 DIAGNOSIS — S335XXS Sprain of ligaments of lumbar spine, sequela: Secondary | ICD-10-CM

## 2016-06-21 DIAGNOSIS — M47816 Spondylosis without myelopathy or radiculopathy, lumbar region: Secondary | ICD-10-CM

## 2016-06-21 DIAGNOSIS — M159 Polyosteoarthritis, unspecified: Secondary | ICD-10-CM

## 2016-06-21 DIAGNOSIS — M79605 Pain in left leg: Secondary | ICD-10-CM | POA: Diagnosis present

## 2016-06-21 DIAGNOSIS — M7060 Trochanteric bursitis, unspecified hip: Secondary | ICD-10-CM | POA: Insufficient documentation

## 2016-06-21 DIAGNOSIS — M545 Low back pain: Secondary | ICD-10-CM | POA: Diagnosis present

## 2016-06-21 DIAGNOSIS — M533 Sacrococcygeal disorders, not elsewhere classified: Secondary | ICD-10-CM

## 2016-06-21 MED ORDER — OXYCODONE HCL 5 MG PO CAPS: ORAL_CAPSULE | ORAL | 0 refills | Status: AC

## 2016-06-21 NOTE — Patient Instructions (Addendum)
PLAN   Continue present medication Neurontin Zanaflex  Cymbalta Flector patch and Voltaren gel Lidoderm patch and oxycodone  Block of nerves to the sacroiliac joint to be performed at time of return appointment  F/U PCP Dr.Bender  for evaliation of  BP and general medical  condition.  F/U surgical evaluation. Follow-up with Dr. Radford Pax as discussed  F/U neurological evaluation. May consider PNCV/EMG studies and other studies as discussed    May consider radiofrequency procedures as well as implantation type procedures pending response to treatment and follow-up evaluation.  Patient to call Pain Management Center should patient have concerns prior to scheduled return appointmentSacroiliac (SI) Joint Injection Patient Information  Description: The sacroiliac joint connects the scrum (very low back and tailbone) to the ilium (a pelvic bone which also forms half of the hip joint).  Normally this joint experiences very little motion.  When this joint becomes inflamed or unstable low back and or hip and pelvis pain may result.  Injection of this joint with local anesthetics (numbing medicines) and steroids can provide diagnostic information and reduce pain.  This injection is performed with the aid of x-ray guidance into the tailbone area while you are lying on your stomach.   You may experience an electrical sensation down the leg while this is being done.  You may also experience numbness.  We also may ask if we are reproducing your normal pain during the injection.  Conditions which may be treated SI injection:   Low back, buttock, hip or leg pain  Preparation for the Injection:  1. Do not eat any solid food or dairy products within 8 hours of your appointment.  2. You may drink clear liquids up to 3 hours before appointment.  Clear liquids include water, black coffee, juice or soda.  No milk or cream please. 3. You may take your regular medications, including pain medications with a sip  of water before your appointment.  Diabetics should hold regular insulin (if take separately) and take 1/2 normal NPH dose the morning of the procedure.  Carry some sugar containing items with you to your appointment. 4. A driver must accompany you and be prepared to drive you home after your procedure. 5. Bring all of your current medications with you. 6. An IV may be inserted and sedation may be given at the discretion of the physician. 7. A blood pressure cuff, EKG and other monitors will often be applied during the procedure.  Some patients may need to have extra oxygen administered for a short period.  8. You will be asked to provide medical information, including your allergies, prior to the procedure.  We must know immediately if you are taking blood thinners (like Coumadin/Warfarin) or if you are allergic to IV iodine contrast (dye).  We must know if you could possible be pregnant.  Possible side effects:   Bleeding from needle site  Infection (rare, may require surgery)  Nerve injury (rare)  Numbness & tingling (temporary)  A brief convulsion or seizure  Light-headedness (temporary)  Pain at injection site (several days)  Decreased blood pressure (temporary)  Weakness in the leg (temporary)   Call if you experience:   New onset weakness or numbness of an extremity below the injection site that last more than 8 hours.  Hives or difficulty breathing ( go to the emergency room)  Inflammation or drainage at the injection site  Any new symptoms which are concerning to you  Please note:  Although the local anesthetic injected  can often make your back/ hip/ buttock/ leg feel good for several hours after the injections, the pain will likely return.  It takes 3-7 days for steroids to work in the sacroiliac area.  You may not notice any pain relief for at least that one week.  If effective, we will often do a series of three injections spaced 3-6 weeks apart to maximally  decrease your pain.  After the initial series, we generally will wait some months before a repeat injection of the same type.  If you have any questions, please call [phone] [facility]

## 2016-06-21 NOTE — Progress Notes (Signed)
     The patient is a 50 year old female who returns to pain management for further evaluation and treatment of pain involving the mid lower back and lower extremity regions. The patient states that she has return of significant pain at this time. The patient states the pain occurs and region of the buttocks on the right side greater than the left side. The patient is status post prior surgical intervention of the lumbar region. The patient states the pain is aggravated by standing walking twisting turning maneuvers and climbing stairs as well. We discussed patient's condition and patient will follow-up with Dr. Radford Pax at [facility] neurosurgery as needed. At the present time we will proceed with scheduling patient for block of nerves to the sacroiliac joint and we'll continue presently prescribed medications. All agreed to suggested treatment plan.   Physical examination  There was tenderness of the splenius capitis and occipitalis region palpation which reproduces mild discomfort with mild tenderness of the cervical and thoracic facet region. There was mild tenderness of the acromioclavicular and glenohumeral joint region and patient appeared to be with bilaterally equal grip strength. Patient was able to perform drop test without significant difficulty. Palpation over the thoracic region was attends to palpation with no crepitus of the thoracic region noted. The patient appeared to be with bilaterally equal grip strength without increased pain with Tinel and Phalen's maneuver. Palpation over the lumbar region was attends to palpation of moderate to moderately severe degree with lateral bending rotation extension and palpation of the lumbar facets reproducing moderately severe discomfort. There was moderately severe tenderness of the PSIS and PII S region. There was increased pain of severe degree with pressure applied to the ileum with patient in lateral decubitus position. Straight leg raise was tolerates  approximately 20 without a definite increase of pain with dorsiflexion noted. No definite sensory deficit of dermatomal distribution detected. DTRs appeared to be trace at the knees. There was negative clonus negative Homans. Abdomen was nontender with no costovertebral tenderness noted     Assessment    Degenerative disc disease lumbar spine Multilevel degenerative changes of the lumbar spine L4-L5 prominent and disc bulging, right paracentral disc protrusion, flattening of the lateral recesses, facet hypertrophy and hypertrophy of the ligamentum flavum flavum contributing to findings with L5-S1 and disc bulging and narrowing of the left neural foramen  Lumbar facet syndrome  Lumbar radiculopathy  Greater trochanteric bursitis  Cervicalgia  Bilateral occipital neuralgia     PLAN   Continue present medication Neurontin Zanaflex  Cymbalta Flector patch and Voltaren gel Lidoderm patch and oxycodone  Block of nerves to the sacroiliac joint to be performed at time of return appointment  F/U PCP Dr.Bender  for evaliation of  BP and general medical  condition.  F/U surgical evaluation. Follow-up with Dr. Radford Pax as discussed  F/U neurological evaluation. May consider PNCV/EMG studies and other studies as discussed    May consider radiofrequency procedures as well as implantation type procedures pending response to treatment and follow-up evaluation.  Patient to call Pain Management Center should patient have concerns prior to scheduled return appointment

## 2016-06-25 ENCOUNTER — Encounter: Payer: Self-pay | Admitting: Pain Medicine

## 2016-06-25 ENCOUNTER — Ambulatory Visit: Payer: Medicaid Other | Attending: Pain Medicine | Admitting: Pain Medicine

## 2016-06-25 VITALS — BP 125/84 | HR 94 | Temp 97.3°F | Resp 18 | Ht 64.0 in | Wt 160.0 lb

## 2016-06-25 DIAGNOSIS — M5136 Other intervertebral disc degeneration, lumbar region: Secondary | ICD-10-CM | POA: Diagnosis not present

## 2016-06-25 DIAGNOSIS — M159 Polyosteoarthritis, unspecified: Secondary | ICD-10-CM

## 2016-06-25 DIAGNOSIS — M5137 Other intervertebral disc degeneration, lumbosacral region: Secondary | ICD-10-CM

## 2016-06-25 DIAGNOSIS — M5126 Other intervertebral disc displacement, lumbar region: Secondary | ICD-10-CM | POA: Insufficient documentation

## 2016-06-25 DIAGNOSIS — M79606 Pain in leg, unspecified: Secondary | ICD-10-CM | POA: Diagnosis present

## 2016-06-25 DIAGNOSIS — M150 Primary generalized (osteo)arthritis: Secondary | ICD-10-CM

## 2016-06-25 DIAGNOSIS — M51379 Other intervertebral disc degeneration, lumbosacral region without mention of lumbar back pain or lower extremity pain: Secondary | ICD-10-CM

## 2016-06-25 DIAGNOSIS — M791 Myalgia: Secondary | ICD-10-CM | POA: Diagnosis present

## 2016-06-25 DIAGNOSIS — M5481 Occipital neuralgia: Secondary | ICD-10-CM

## 2016-06-25 DIAGNOSIS — S335XXS Sprain of ligaments of lumbar spine, sequela: Secondary | ICD-10-CM

## 2016-06-25 DIAGNOSIS — M533 Sacrococcygeal disorders, not elsewhere classified: Secondary | ICD-10-CM

## 2016-06-25 DIAGNOSIS — M5416 Radiculopathy, lumbar region: Secondary | ICD-10-CM

## 2016-06-25 DIAGNOSIS — M545 Low back pain: Secondary | ICD-10-CM | POA: Diagnosis present

## 2016-06-25 DIAGNOSIS — M47816 Spondylosis without myelopathy or radiculopathy, lumbar region: Secondary | ICD-10-CM

## 2016-06-25 MED ORDER — MIDAZOLAM HCL 5 MG/5ML IJ SOLN
INTRAMUSCULAR | Status: AC
  Filled 2016-06-25: qty 5

## 2016-06-25 MED ORDER — TRIAMCINOLONE ACETONIDE 40 MG/ML IJ SUSP
INTRAMUSCULAR | Status: AC
  Filled 2016-06-25: qty 1

## 2016-06-25 MED ORDER — CEFAZOLIN SODIUM 1 G IJ SOLR
INTRAMUSCULAR | Status: AC
  Filled 2016-06-25: qty 10

## 2016-06-25 MED ORDER — ORPHENADRINE CITRATE 30 MG/ML IJ SOLN: 60.0000 mg | Freq: Once | INTRAMUSCULAR | Status: AC

## 2016-06-25 MED ORDER — ORPHENADRINE CITRATE 30 MG/ML IJ SOLN
INTRAMUSCULAR | Status: AC
  Filled 2016-06-25: qty 2

## 2016-06-25 MED ORDER — MIDAZOLAM HCL 5 MG/5ML IJ SOLN: 5.0000 mg | Freq: Once | INTRAMUSCULAR | Status: AC

## 2016-06-25 MED ORDER — LACTATED RINGERS IV SOLN: 1000.0000 mL | INTRAVENOUS | Status: AC

## 2016-06-25 MED ORDER — BUPIVACAINE HCL (PF) 0.25 % IJ SOLN
INTRAMUSCULAR | Status: AC
  Administered 2016-06-25: 14:00:00
  Filled 2016-06-25: qty 30

## 2016-06-25 MED ORDER — FENTANYL CITRATE (PF) 100 MCG/2ML IJ SOLN
INTRAMUSCULAR | Status: AC
  Filled 2016-06-25: qty 2

## 2016-06-25 MED ORDER — TRIAMCINOLONE ACETONIDE 40 MG/ML IJ SUSP: 40.0000 mg | Freq: Once | INTRAMUSCULAR | Status: AC

## 2016-06-25 MED ORDER — FENTANYL CITRATE (PF) 100 MCG/2ML IJ SOLN
100.0000 ug | Freq: Once | INTRAMUSCULAR | Status: AC
  Administered 2016-06-25: 100 ug via INTRAVENOUS

## 2016-06-25 MED ORDER — CEFAZOLIN IN D5W 1 GM/50ML IV SOLN
1.0000 g | Freq: Once | INTRAVENOUS | Status: AC
  Administered 2016-06-25: 1 g via INTRAVENOUS

## 2016-06-25 MED ORDER — CEFUROXIME AXETIL 250 MG PO TABS: 250.0000 mg | ORAL_TABLET | Freq: Two times a day (BID) | ORAL | 0 refills | Status: DC

## 2016-06-25 MED ORDER — BUPIVACAINE HCL (PF) 0.25 % IJ SOLN: 30.0000 mL | Freq: Once | INTRAMUSCULAR | Status: AC

## 2016-06-25 NOTE — Patient Instructions (Addendum)
PLAN  Continue present medication Neurontin Zanaflex  Cymbalta Flector patch and Voltaren gel Lidoderm patch and oxycodone and  Begin taking antibiotic Ceftin antibiotic as prescribed. Please obtain your antibiotic Ceftin today and begin taking antibiotic today  F/U PCP Dr.Bender  for evaliation of  BP and general medical  condition.  F/U surgical evaluation. Follow-up with Dr. Radford Pax as discussed  F/U neurological evaluation. May consider PNCV/EMG studies and other studies as discussed    May consider radiofrequency procedures as well as implantation type procedures pending response to treatment and follow-up evaluation.  Patient to call Pain Management Center should patient have concerns prior to scheduled return appointmentSacroiliac (SI) Joint Injection Patient Information  Description: The sacroiliac joint connects the scrum (very low back and tailbone) to the ilium (a pelvic bone which also forms half of the hip joint).  Normally this joint experiences very little motion.  When this joint becomes inflamed or unstable low back and or hip and pelvis pain may result.  Injection of this joint with local anesthetics (numbing medicines) and steroids can provide diagnostic information and reduce pain.  This injection is performed with the aid of x-ray guidance into the tailbone area while you are lying on your stomach.   You may experience an electrical sensation down the leg while this is being done.  You may also experience numbness.  We also may ask if we are reproducing your normal pain during the injection.  Conditions which may be treated SI injection:   Low back, buttock, hip or leg pain  Preparation for the Injection:  1. Do not eat any solid food or dairy products within 8 hours of your appointment.  2. You may drink clear liquids up to 3 hours before appointment.  Clear liquids include water, black coffee, juice or soda.  No milk or cream please. 3. You may take your regular  medications, including pain medications with a sip of water before your appointment.  Diabetics should hold regular insulin (if take separately) and take 1/2 normal NPH dose the morning of the procedure.  Carry some sugar containing items with you to your appointment. 4. A driver must accompany you and be prepared to drive you home after your procedure. 5. Bring all of your current medications with you. 6. An IV may be inserted and sedation may be given at the discretion of the physician. 7. A blood pressure cuff, EKG and other monitors will often be applied during the procedure.  Some patients may need to have extra oxygen administered for a short period.  8. You will be asked to provide medical information, including your allergies, prior to the procedure.  We must know immediately if you are taking blood thinners (like Coumadin/Warfarin) or if you are allergic to IV iodine contrast (dye).  We must know if you could possible be pregnant.  Possible side effects:   Bleeding from needle site  Infection (rare, may require surgery)  Nerve injury (rare)  Numbness & tingling (temporary)  A brief convulsion or seizure  Light-headedness (temporary)  Pain at injection site (several days)  Decreased blood pressure (temporary)  Weakness in the leg (temporary)   Call if you experience:   New onset weakness or numbness of an extremity below the injection site that last more than 8 hours.  Hives or difficulty breathing ( go to the emergency room)  Inflammation or drainage at the injection site  Any new symptoms which are concerning to you  Please note:  Although the  local anesthetic injected can often make your back/ hip/ buttock/ leg feel good for several hours after the injections, the pain will likely return.  It takes 3-7 days for steroids to work in the sacroiliac area.  You may not notice any pain relief for at least that one week.  If effective, we will often do a series of  three injections spaced 3-6 weeks apart to maximally decrease your pain.  After the initial series, we generally will wait some months before a repeat injection of the same type.  If you have any questions, please call [phone] [facility]  Pain Management Discharge Instructions  General Discharge Instructions :  If you need to reach your doctor call: Monday-Friday 8:00 am - 4:00 pm at [phone] or toll free [phone].  After clinic hours [phone] to have operator reach doctor.  Bring all of your medication bottles to all your appointments in the pain clinic.  To cancel or reschedule your appointment with Pain Management please remember to call 24 hours in advance to avoid a fee.  Refer to the educational materials which you have been given on: General Risks, I had my Procedure. Discharge Instructions, Post Sedation.  Post Procedure Instructions:  The drugs you were given will stay in your system until tomorrow, so for the next 24 hours you should not drive, make any legal decisions or drink any alcoholic beverages.  You may eat anything you prefer, but it is better to start with liquids then soups and crackers, and gradually work up to solid foods.  Please notify your doctor immediately if you have any unusual bleeding, trouble breathing or pain that is not related to your normal pain.  Depending on the type of procedure that was done, some parts of your body may feel week and/or numb.  This usually clears up by tonight or the next day.  Walk with the use of an assistive device or accompanied by an adult for the 24 hours.  You may use ice on the affected area for the first 24 hours.  Put ice in a Ziploc bag and cover with a towel and place against area 15 minutes on 15 minutes off.  You may switch to heat after 24 hours.GENERAL RISKS AND COMPLICATIONS  What are the risk, side effects and possible complications? Generally speaking,  most procedures are safe.  However, with any procedure there are risks, side effects, and the possibility of complications.  The risks and complications are dependent upon the sites that are lesioned, or the type of nerve block to be performed.  The closer the procedure is to the spine, the more serious the risks are.  Great care is taken when placing the radio frequency needles, block needles or lesioning probes, but sometimes complications can occur. 1. Infection: Any time there is an injection through the skin, there is a risk of infection.  This is why sterile conditions are used for these blocks.  There are four possible types of infection. 1. Localized skin infection. 2. Central Nervous System Infection-This can be in the form of Meningitis, which can be deadly. 3. Epidural Infections-This can be in the form of an epidural abscess, which can cause pressure inside of the spine, causing compression of the spinal cord with subsequent paralysis. This would require an emergency surgery to decompress, and there are no guarantees that the patient would recover from the paralysis. 4. Discitis-This is an infection of the intervertebral discs.  It occurs in  about 1% of discography procedures.  It is difficult to treat and it may lead to surgery.        2. Pain: the needles have to go through skin and soft tissues, will cause soreness.       3. Damage to internal structures:  The nerves to be lesioned may be near blood vessels or    other nerves which can be potentially damaged.       4. Bleeding: Bleeding is more common if the patient is taking blood thinners such as  aspirin, Coumadin, Ticiid, Plavix, etc., or if he/she have some genetic predisposition  such as hemophilia. Bleeding into the spinal canal can cause compression of the spinal  cord with subsequent paralysis.  This would require an emergency surgery to  decompress and there are no guarantees that the patient would recover from the  paralysis.        5. Pneumothorax:  Puncturing of a lung is a possibility, every time a needle is introduced in  the area of the chest or upper back.  Pneumothorax refers to free air around the  collapsed lung(s), inside of the thoracic cavity (chest cavity).  Another two possible  complications related to a similar event would include: Hemothorax and Chylothorax.   These are variations of the Pneumothorax, where instead of air around the collapsed  lung(s), you may have blood or chyle, respectively.       6. Spinal headaches: They may occur with any procedures in the area of the spine.       7. Persistent CSF (Cerebro-Spinal Fluid) leakage: This is a rare problem, but may occur  with prolonged intrathecal or epidural catheters either due to the formation of a fistulous  track or a dural tear.       8. Nerve damage: By working so close to the spinal cord, there is always a possibility of  nerve damage, which could be as serious as a permanent spinal cord injury with  paralysis.       9. Death:  Although rare, severe deadly allergic reactions known as "Anaphylactic  reaction" can occur to any of the medications used.      10. Worsening of the symptoms:  We can always make thing worse.  What are the chances of something like this happening? Chances of any of this occuring are extremely low.  By statistics, you have more of a chance of getting killed in a motor vehicle accident: while driving to the hospital than any of the above occurring .  Nevertheless, you should be aware that they are possibilities.  In general, it is similar to taking a shower.  Everybody knows that you can slip, hit your head and get killed.  Does that mean that you should not shower again?  Nevertheless always keep in mind that statistics do not mean anything if you happen to be on the wrong side of them.  Even if a procedure has a 1 (one) in a 1,000,000 (million) chance of going wrong, it you happen to be that one..Also, keep in mind that by  statistics, you have more of a chance of having something go wrong when taking medications.  Who should not have this procedure? If you are on a blood thinning medication (e.g. Coumadin, Plavix, see list of "Blood Thinners"), or if you have an active infection going on, you should not have the procedure.  If you are taking any blood thinners, please inform your physician.  How should  I prepare for this procedure?  Do not eat or drink anything at least six hours prior to the procedure.  Bring a driver with you .  It cannot be a taxi.  Come accompanied by an adult that can drive you back, and that is strong enough to help you if your legs get weak or numb from the local anesthetic.  Take all of your medicines the morning of the procedure with just enough water to swallow them.  If you have diabetes, make sure that you are scheduled to have your procedure done first thing in the morning, whenever possible.  If you have diabetes, take only half of your insulin dose and notify our nurse that you have done so as soon as you arrive at the clinic.  If you are diabetic, but only take blood sugar pills (oral hypoglycemic), then do not take them on the morning of your procedure.  You may take them after you have had the procedure.  Do not take aspirin or any aspirin-containing medications, at least eleven (11) days prior to the procedure.  They may prolong bleeding.  Wear loose fitting clothing that may be easy to take off and that you would not mind if it got stained with Betadine or blood.  Do not wear any jewelry or perfume  Remove any nail coloring.  It will interfere with some of our monitoring equipment.  NOTE: Remember that this is not meant to be interpreted as a complete list of all possible complications.  Unforeseen problems may occur.  BLOOD THINNERS The following drugs contain aspirin or other products, which can cause increased bleeding during surgery and should not be taken for 2  weeks prior to and 1 week after surgery.  If you should need take something for relief of minor pain, you may take acetaminophen which is found in Tylenol,m Datril, Anacin-3 and Panadol. It is not blood thinner. The products listed below are.  Do not take any of the products listed below in addition to any listed on your instruction sheet.  A.P.C or A.P.C with Codeine Codeine Phosphate Capsules #3 Ibuprofen Ridaura  ABC compound Congesprin Imuran rimadil  Advil Cope Indocin Robaxisal  Alka-Seltzer Effervescent Pain Reliever and Antacid Coricidin or Coricidin-D  Indomethacin Rufen  Alka-Seltzer plus Cold Medicine Cosprin Ketoprofen S-A-C Tablets  Anacin Analgesic Tablets or Capsules Coumadin Korlgesic Salflex  Anacin Extra Strength Analgesic tablets or capsules CP-2 Tablets Lanoril Salicylate  Anaprox Cuprimine Capsules Levenox Salocol  Anexsia-D Dalteparin Magan Salsalate  Anodynos Darvon compound Magnesium Salicylate Sine-off  Ansaid Dasin Capsules Magsal Sodium Salicylate  Anturane Depen Capsules Marnal Soma  APF Arthritis pain formula Dewitt's Pills Measurin Stanback  Argesic Dia-Gesic Meclofenamic Sulfinpyrazone  Arthritis Bayer Timed Release Aspirin Diclofenac Meclomen Sulindac  Arthritis pain formula Anacin Dicumarol Medipren Supac  Analgesic (Safety coated) Arthralgen Diffunasal Mefanamic Suprofen  Arthritis Strength Bufferin Dihydrocodeine Mepro Compound Suprol  Arthropan liquid Dopirydamole Methcarbomol with Aspirin Synalgos  ASA tablets/Enseals Disalcid Micrainin Tagament  Ascriptin Doan's Midol Talwin  Ascriptin A/D Dolene Mobidin Tanderil  Ascriptin Extra Strength Dolobid Moblgesic Ticlid  Ascriptin with Codeine Doloprin or Doloprin with Codeine Momentum Tolectin  Asperbuf Duoprin Mono-gesic Trendar  Aspergum Duradyne Motrin or Motrin IB Triminicin  Aspirin plain, buffered or enteric coated Durasal Myochrisine Trigesic  Aspirin Suppositories Easprin Nalfon Trillsate   Aspirin with Codeine Ecotrin Regular or Extra Strength Naprosyn Uracel  Atromid-S Efficin Naproxen Ursinus  Auranofin Capsules Elmiron Neocylate Vanquish  Axotal Emagrin Norgesic Verin  Azathioprine Empirin or  Empirin with Codeine Normiflo Vitamin E  Azolid Emprazil Nuprin Voltaren  Bayer Aspirin plain, buffered or children's or timed BC Tablets or powders Encaprin Orgaran Warfarin Sodium  Buff-a-Comp Enoxaparin Orudis Zorpin  Buff-a-Comp with Codeine Equegesic Os-Cal-Gesic   Buffaprin Excedrin plain, buffered or Extra Strength Oxalid   Bufferin Arthritis Strength Feldene Oxphenbutazone   Bufferin plain or Extra Strength Feldene Capsules Oxycodone with Aspirin   Bufferin with Codeine Fenoprofen Fenoprofen Pabalate or Pabalate-SF   Buffets II Flogesic Panagesic   Buffinol plain or Extra Strength Florinal or Florinal with Codeine Panwarfarin   Buf-Tabs Flurbiprofen Penicillamine   Butalbital Compound Four-way cold tablets Penicillin   Butazolidin Fragmin Pepto-Bismol   Carbenicillin Geminisyn Percodan   Carna Arthritis Reliever Geopen Persantine   Carprofen Gold's salt Persistin   Chloramphenicol Goody's Phenylbutazone   Chloromycetin Haltrain Piroxlcam   Clmetidine heparin Plaquenil   Cllnoril Hyco-pap Ponstel   Clofibrate Hydroxy chloroquine Propoxyphen         Before stopping any of these medications, be sure to consult the physician who ordered them.  Some, such as Coumadin (Warfarin) are ordered to prevent or treat serious conditions such as "deep thrombosis", "pumonary embolisms", and other heart problems.  The amount of time that you may need off of the medication may also vary with the medication and the reason for which you were taking it.  If you are taking any of these medications, please make sure you notify your pain physician before you undergo any procedures.

## 2016-06-25 NOTE — Progress Notes (Signed)
Safety precautions to be maintained throughout the outpatient stay will include: orient to surroundings, keep bed in low position, maintain call bell within reach at all times, provide assistance with transfer out of bed and ambulation.  

## 2016-06-25 NOTE — Progress Notes (Signed)
PROCEDURE:  Block of nerves to the sacroiliac joint.   NOTE:  The patient is a 50 y.o. female who returns to the Pain Management Center for further evaluation and treatment of pain involving the lower back and lower extremity region with pain in the region of the buttocks as well. Prior MRI studies revealed Degenerative disc disease lumbar spine Multilevel degenerative changes of the lumbar spine L4-L5 prominent and disc bulging, right paracentral disc protrusion, flattening of the lateral recesses, facet hypertrophy and hypertrophy of the ligamentum flavum flavum contributing to findings with L5-S1 and disc bulging and narrowing of the left neural foramen.. The patient is with reproduction of severe pain with palpation over the PSIS and PII S regions and has positive Patrick's maneuver    There is concern regarding a significant component of the patient's pain being due to sacroiliac joint dysfunction The risks, benefits, expectations of the procedure have been discussed and explained to the patient who is understanding and willing to proceed with interventional treatment in attempt to decrease severity of patient's symptoms, minimize the risk of medication escalation and  hopefully retard the progression of the patient's symptoms. We will proceed with what is felt to be a medically necessary procedure, block of nerves to the sacroiliac joint.   DESCRIPTION OF PROCEDURE:  Block of nerves to the sacroiliac joint.   The patient was taken to the fluoroscopy suite. With the patient in the prone position with EKG, blood pressure, pulse, capnography, and pulse oximetry monitoring, IV Versed, IV fentanyl conscious sedation, Betadine prep of proposed entry site was performed.   Block of nerves at the L5 vertebral body level.   With the patient in prone position, under fluoroscopic guidance, a 22 -gauge needle was inserted at the L5 vertebral body level on the left side. With 15 degrees oblique orientation  a 22 -gauge needle was inserted in the region known as Burton's eye or eye of the Scotty dog. Following documentation of needle placement in the area of Burton's eye or eye of the Scotty dog under fluoroscopic guidance, needle placement was then accomplished at the sacral ala level on the left side.   Needle placement at the sacral ala.   With the patient in prone position under fluoroscopic guidance with AP view of the lumbosacral spine, a 22 -gauge needle was inserted in the region known as the sacral ala on the left side. Following documentation of needle placement on the left side under fluoroscopic guidance needle placement was then accomplished at the S1 foramen level.   Needle placement at the S1 foramen level.   With the patient in prone position under fluoroscopic guidance with AP view of the lumbosacral spine and cephalad orientation, a 22 -gauge needle was inserted at the superior and lateral border of the S1 foramen on the left side. Following documentation of needle placement at the S1 foramen level on the left side, needle placement was then accomplished at the S2 foramen level on the left side.   Needle placement at the S2 foramen level.   With the patient in prone position with AP view of the lumbosacral spine with cephalad orientation, a 22 - gauge needle was inserted at the superior and lateral border of the S2 foramen under fluoroscopic guidance on the left side. Following needle placement at the L5 vertebral body level, sacral ala, S1 foramen and S2 foramen on the left side, needle placement was verified on lateral view under fluoroscopic guidance.  Following needle placement documentation  on lateral view, each needle was injected with 1 mL of 0.25% bupivacaine and Kenalog.   BLOCK OF THE NERVES TO SACROILIAC JOINT ON THE RIGHT SIDE The procedure was performed on the right side at the same levels as was performed on the left side and utilizing the same technique as on the left  side and was performed under fluoroscopic guidance as on the left side   A total of 10mg  of Kenalog was utilized for the procedure.   PLAN:  1. Medications: The patient will continue presently prescribed medications Neurontin Zanaflex Cymbalta Flector patch for pterin gel Lidoderm patch and oxycodone  2. The patient will be considered for modification of treatment regimen pending response to the procedure performed on today's visit.  3. The patient is to follow-up with primary care physician Dr. Rebeca Alert for evaluation of blood pressure and general medical condition following the procedure performed on today's visit.  4. Surgical evaluation as discussed. The patient will follow-up with Dr. Radford Pax of [place] neuro[facility] 5. Neurological evaluation as discussed.  6. The patient may be a candidate for radiofrequency procedures, implantation devices and other treatment pending response to treatment performed on today's visit and follow-up evaluation.  7. The patient has been advised to adhere to proper body mechanics and to avoid activities which may exacerbate the patient's symptoms.   Return appointment to Pain Management Center as scheduled.

## 2016-06-26 ENCOUNTER — Telehealth: Payer: Self-pay | Admitting: *Deleted

## 2016-06-26 NOTE — Telephone Encounter (Signed)
No problems post procedure. 

## 2016-07-10 ENCOUNTER — Telehealth: Payer: Self-pay | Admitting: *Deleted

## 2016-07-12 ENCOUNTER — Other Ambulatory Visit: Payer: Self-pay | Admitting: Family Medicine

## 2016-07-12 DIAGNOSIS — Z1231 Encounter for screening mammogram for malignant neoplasm of breast: Secondary | ICD-10-CM

## 2016-07-18 ENCOUNTER — Ambulatory Visit: Payer: Medicaid Other | Attending: Pain Medicine | Admitting: Pain Medicine

## 2016-07-18 ENCOUNTER — Encounter: Payer: Self-pay | Admitting: Pain Medicine

## 2016-07-18 VITALS — BP 152/85 | HR 90 | Temp 98.3°F | Resp 18 | Ht 64.0 in | Wt 152.0 lb

## 2016-07-18 DIAGNOSIS — M5116 Intervertebral disc disorders with radiculopathy, lumbar region: Secondary | ICD-10-CM | POA: Insufficient documentation

## 2016-07-18 DIAGNOSIS — M546 Pain in thoracic spine: Secondary | ICD-10-CM | POA: Diagnosis present

## 2016-07-18 DIAGNOSIS — M5481 Occipital neuralgia: Secondary | ICD-10-CM

## 2016-07-18 DIAGNOSIS — M150 Primary generalized (osteo)arthritis: Secondary | ICD-10-CM

## 2016-07-18 DIAGNOSIS — M542 Cervicalgia: Secondary | ICD-10-CM | POA: Diagnosis not present

## 2016-07-18 DIAGNOSIS — Z9889 Other specified postprocedural states: Secondary | ICD-10-CM | POA: Diagnosis not present

## 2016-07-18 DIAGNOSIS — M47896 Other spondylosis, lumbar region: Secondary | ICD-10-CM | POA: Diagnosis not present

## 2016-07-18 DIAGNOSIS — M159 Polyosteoarthritis, unspecified: Secondary | ICD-10-CM

## 2016-07-18 DIAGNOSIS — M5137 Other intervertebral disc degeneration, lumbosacral region: Secondary | ICD-10-CM

## 2016-07-18 DIAGNOSIS — M5416 Radiculopathy, lumbar region: Secondary | ICD-10-CM

## 2016-07-18 DIAGNOSIS — M545 Low back pain: Secondary | ICD-10-CM | POA: Diagnosis present

## 2016-07-18 DIAGNOSIS — M7060 Trochanteric bursitis, unspecified hip: Secondary | ICD-10-CM | POA: Diagnosis not present

## 2016-07-18 DIAGNOSIS — M533 Sacrococcygeal disorders, not elsewhere classified: Secondary | ICD-10-CM

## 2016-07-18 DIAGNOSIS — M5126 Other intervertebral disc displacement, lumbar region: Secondary | ICD-10-CM | POA: Diagnosis not present

## 2016-07-18 DIAGNOSIS — M51379 Other intervertebral disc degeneration, lumbosacral region without mention of lumbar back pain or lower extremity pain: Secondary | ICD-10-CM

## 2016-07-18 MED ORDER — OXYCODONE HCL 5 MG PO CAPS: ORAL_CAPSULE | ORAL | 0 refills | Status: AC

## 2016-07-18 MED ORDER — DICLOFENAC EPOLAMINE 1.3 % TD PTCH: MEDICATED_PATCH | TRANSDERMAL | 2 refills | Status: AC

## 2016-07-18 MED ORDER — TIZANIDINE HCL 2 MG PO CAPS: ORAL_CAPSULE | ORAL | 2 refills | Status: AC

## 2016-07-18 MED ORDER — DICLOFENAC SODIUM 1 % TD GEL: TRANSDERMAL | 2 refills | Status: AC

## 2016-07-18 MED ORDER — GABAPENTIN 400 MG PO CAPS: ORAL_CAPSULE | ORAL | 2 refills | Status: DC

## 2016-07-18 MED ORDER — GABAPENTIN 400 MG PO CAPS: ORAL_CAPSULE | ORAL | 2 refills | Status: AC

## 2016-07-18 MED ORDER — LIDOCAINE 5 % EX PTCH: MEDICATED_PATCH | CUTANEOUS | 2 refills | Status: AC

## 2016-07-18 MED ORDER — DULOXETINE HCL 30 MG PO CPEP: ORAL_CAPSULE | ORAL | 2 refills | Status: AC

## 2016-07-18 NOTE — Progress Notes (Signed)
Safety precautions to be maintained throughout the outpatient stay will include: orient to surroundings, keep bed in low position, maintain call bell within reach at all times, provide assistance with transfer out of bed and ambulation.  

## 2016-07-18 NOTE — Patient Instructions (Signed)
  PLAN   Continue present medication Neurontin Zanaflex  Cymbalta Flector patch and Voltaren gel Lidoderm patch and oxycodone  F/U PCP Dr.Bender  for evaliation of  BP and general medical  condition.  F/U surgical evaluation. Follow-up with Dr. Radford Pax as discussed  F/U neurological evaluation. May consider PNCV/EMG studies and other studies as discussed    May consider radiofrequency procedures as well as implantation type procedures pending response to treatment and follow-up evaluation.  Patient to call [facility] should patient have concerns prior to scheduled return appointment

## 2016-07-19 ENCOUNTER — Ambulatory Visit: Payer: Medicaid Other | Admitting: Pain Medicine

## 2016-07-19 NOTE — Progress Notes (Signed)
      The patient is a 50 year old female who returns to pain management for further evaluation and treatment of pain involving the lower back lower extremity region with pain occurring across the region of the back radiating to the right lower extremity more than the left lower extremity. Patient is status post surgical intervention of the lumbar region performed by Dr. Manson Passey [facility]. Patient denies any recent trauma change in events of daily living the call significant change in symptomatology. We discussed patient's condition and we will consider patient for treatment including interventional treatment pending follow-up evaluations. At the present time we will increase Neurontin by the burning stinging pain of the lower extremity and will consider additional modifications of treatment pending further follow-up evaluations. The patient was with understanding and agreed to suggested treatment plan.    Physical examination   There was tenderness of the splenius capitis and occipitalis region palpation which reproduces pain of mild-to-moderate degree with mild to moderate tenderness of the cervical facet cervical paraspinal musculature region. There was mild to moderate tensed palpation over the region of the thoracic paraspinal musculature and thoracic facet region with no crepitus of the thoracic region noted. Palpation of the acromioclavicular and glenohumeral joint regions reproduce mild to moderate discomfort and patient appeared to be with bilaterally equal grip strength without increased pain with Tinel and Phalen's maneuver. There was no crepitus of the thoracic region noted. Palpation over the lumbar paraspinal muscular region was associated with moderate to moderately severe increase of pain with lateral bending rotation extension and palpation over the lumbar facets reproducing moderately severe discomfort. Palpation over the PSIS and PII S regions reproduced  moderately severe discomfort. DTRs appeared to be trace at the knees. No definite sensory deficit of dermatomal distribution was detected. There appeared to be negative clonus negative Homans. Abdomen was nontender with no costovertebral angle tenderness noted.     Assessment   Degenerative disc disease lumbar spine Multilevel degenerative changes of the lumbar spine L4-L5 prominent and disc bulging, right paracentral disc protrusion, flattening of the lateral recesses, facet hypertrophy and hypertrophy of the ligamentum flavum flavum contributing to findings with L5-S1 and disc bulging and narrowing of the left neural foramen  Lumbar facet syndrome  Lumbar radiculopathy  Greater trochanteric bursitis  Cervicalgia  Bilateral occipital neuralgia

## 2016-07-20 ENCOUNTER — Telehealth: Payer: Self-pay

## 2016-07-20 NOTE — Telephone Encounter (Signed)
Yvette Rack called back, she needs her oxycodone pre authed. Also, he wrote for capsules and they dont have them, they only have pills.

## 2016-07-20 NOTE — Telephone Encounter (Signed)
Left message with patient to clarify which medications that she needed a PA on.   Instructed patient to call back.

## 2016-07-20 NOTE — Telephone Encounter (Signed)
Left message with patient that Lidocaine patch has been approved

## 2016-07-20 NOTE — Telephone Encounter (Signed)
Patient called and left vm saying Dr. Primus Bravo told her to call Theresa Cobb to get her medicines. She will need a prior auth. She would like for Jocelyn Lamer to call her.

## 2016-07-23 ENCOUNTER — Telehealth: Payer: Self-pay

## 2016-07-23 NOTE — Telephone Encounter (Signed)
Dena completed PA for oxycodone and sent it in.

## 2016-07-23 NOTE — Telephone Encounter (Signed)
Left message with patient that Dena had done PA on Friday and we have not heard from it yet.

## 2016-07-23 NOTE — Telephone Encounter (Signed)
Patient called back and said [facility] still has not gotten the approval for her meds. Is someone working on this? You can leave her a message on her cell.

## 2016-08-01 ENCOUNTER — Ambulatory Visit: Payer: Medicaid Other

## 2016-08-15 ENCOUNTER — Ambulatory Visit: Payer: Medicaid Other

## 2016-08-21 ENCOUNTER — Other Ambulatory Visit: Payer: Self-pay | Admitting: Pain Medicine

## 2016-09-12 ENCOUNTER — Other Ambulatory Visit: Payer: Self-pay | Admitting: Pain Medicine

## 2016-09-17 ENCOUNTER — Ambulatory Visit
Admission: RE | Admit: 2016-09-17 | Discharge: 2016-09-17 | Disposition: A | Discharge status: Home / Self Care | Payer: Medicaid Other | Source: Ambulatory Visit | Attending: Family Medicine | Admitting: Family Medicine

## 2016-09-17 DIAGNOSIS — Z1231 Encounter for screening mammogram for malignant neoplasm of breast: Secondary | ICD-10-CM | POA: Insufficient documentation

## 2016-09-19 ENCOUNTER — Other Ambulatory Visit: Payer: Self-pay | Admitting: Pain Medicine

## 2016-11-20 ENCOUNTER — Other Ambulatory Visit: Payer: Self-pay | Admitting: Pain Medicine

## 2016-12-17 ENCOUNTER — Other Ambulatory Visit: Payer: Self-pay | Admitting: Pain Medicine

## 2019-03-14 ENCOUNTER — Other Ambulatory Visit: Payer: Self-pay

## 2019-03-14 ENCOUNTER — Emergency Department
Admission: EM | Admit: 2019-03-14 | Discharge: 2019-03-15 | Disposition: A | Discharge status: Home / Self Care | Payer: 59 | Attending: Student in an Organized Health Care Education/Training Program | Admitting: Student in an Organized Health Care Education/Training Program

## 2019-03-14 ENCOUNTER — Emergency Department: Payer: 59

## 2019-03-14 ENCOUNTER — Encounter: Payer: Self-pay | Admitting: Emergency Medicine

## 2019-03-14 DIAGNOSIS — Z20828 Contact with and (suspected) exposure to other viral communicable diseases: Secondary | ICD-10-CM | POA: Diagnosis not present

## 2019-03-14 DIAGNOSIS — M791 Myalgia, unspecified site: Secondary | ICD-10-CM | POA: Insufficient documentation

## 2019-03-14 DIAGNOSIS — R6889 Other general symptoms and signs: Secondary | ICD-10-CM

## 2019-03-14 DIAGNOSIS — R51 Headache: Secondary | ICD-10-CM | POA: Insufficient documentation

## 2019-03-14 DIAGNOSIS — R05 Cough: Secondary | ICD-10-CM | POA: Diagnosis not present

## 2019-03-14 DIAGNOSIS — Z79899 Other long term (current) drug therapy: Secondary | ICD-10-CM | POA: Insufficient documentation

## 2019-03-14 LAB — COMPREHENSIVE METABOLIC PANEL
ALT: 27 U/L (ref 0–44)
AST: 24 U/L (ref 15–41)
Albumin: 3.9 g/dL (ref 3.5–5.0)
Alkaline Phosphatase: 71 U/L (ref 38–126)
Anion gap: 8 (ref 5–15)
BUN: 15 mg/dL (ref 6–20)
CO2: 23 mmol/L (ref 22–32)
Calcium: 8.9 mg/dL (ref 8.9–10.3)
Chloride: 109 mmol/L (ref 98–111)
Creatinine, Ser: 0.65 mg/dL (ref 0.44–1.00)
GFR calc Af Amer: >60 mL/min (ref >60)
GFR calc non Af Amer: >60 mL/min (ref >60)
Glucose, Bld: 105 mg/dL — ABNORMAL HIGH (ref 70–99)
Potassium: 3.2 mmol/L — ABNORMAL LOW (ref 3.5–5.1)
Sodium: 140 mmol/L (ref 135–145)
Total Bilirubin: 0.3 mg/dL (ref 0.3–1.2)
Total Protein: 7.1 g/dL (ref 6.5–8.1)

## 2019-03-14 LAB — CBC WITH DIFFERENTIAL/PLATELET
Abs Immature Granulocytes: 0.01 10*3/uL (ref 0.00–0.07)
Basophils Absolute: 0 10*3/uL (ref 0.0–0.1)
Basophils Relative: 0 %
Eosinophils Absolute: 0.1 10*3/uL (ref 0.0–0.5)
Eosinophils Relative: 2 %
HCT: 33.4 % — ABNORMAL LOW (ref 36.0–46.0)
Hemoglobin: 10.9 g/dL — ABNORMAL LOW (ref 12.0–15.0)
Immature Granulocytes: 0 %
Lymphocytes Relative: 48 %
Lymphs Abs: 2.5 10*3/uL (ref 0.7–4.0)
MCH: 29.8 pg (ref 26.0–34.0)
MCHC: 32.6 g/dL (ref 30.0–36.0)
MCV: 91.3 fL (ref 80.0–100.0)
Monocytes Absolute: 0.6 10*3/uL (ref 0.1–1.0)
Monocytes Relative: 12 %
Neutro Abs: 2 10*3/uL (ref 1.7–7.7)
Neutrophils Relative %: 38 %
Platelets: 266 10*3/uL (ref 150–400)
RBC: 3.66 MIL/uL — ABNORMAL LOW (ref 3.87–5.11)
RDW: 13 % (ref 11.5–15.5)
WBC: 5.2 10*3/uL (ref 4.0–10.5)
nRBC: 0 % (ref 0.0–0.2)

## 2019-03-14 LAB — TROPONIN I: Troponin I: <0.03 ng/mL (ref <0.03)

## 2019-03-14 MED ORDER — SODIUM CHLORIDE 0.9 % IV BOLUS
500.0000 mL | Freq: Once | INTRAVENOUS | Status: AC
  Administered 2019-03-14: 500 mL via INTRAVENOUS

## 2019-03-14 MED ORDER — ACETAMINOPHEN 500 MG PO TABS
1000.0000 mg | ORAL_TABLET | Freq: Once | ORAL | Status: AC
  Administered 2019-03-14: 1000 mg via ORAL
  Filled 2019-03-14: qty 2

## 2019-03-14 MED ORDER — DIPHENHYDRAMINE HCL 50 MG/ML IJ SOLN
12.5000 mg | Freq: Once | INTRAMUSCULAR | Status: AC
  Administered 2019-03-14: 12.5 mg via INTRAVENOUS
  Filled 2019-03-14: qty 1

## 2019-03-14 MED ORDER — PROCHLORPERAZINE EDISYLATE 10 MG/2ML IJ SOLN
10.0000 mg | Freq: Once | INTRAMUSCULAR | Status: AC
  Administered 2019-03-14: 10 mg via INTRAVENOUS
  Filled 2019-03-14: qty 2
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
## 2019-03-14 NOTE — ED Triage Notes (Signed)
Pt arrives POV with generalized body aches. 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 Pt is in NAD.

## 2019-03-14 NOTE — ED Notes (Signed)
Per MD Quentin Cornwall, hold swabs until pt chest xray resulted.

## 2019-03-15 LAB — URINALYSIS, COMPLETE (UACMP) WITH MICROSCOPIC
Bilirubin Urine: NEGATIVE
Glucose, UA: NEGATIVE mg/dL
Ketones, ur: NEGATIVE mg/dL
Leukocytes,Ua: NEGATIVE
Nitrite: NEGATIVE
Protein, ur: NEGATIVE mg/dL
Specific Gravity, Urine: 1.018 (ref 1.005–1.030)
pH: 5 (ref 5.0–8.0)

## 2019-03-15 LAB — INFLUENZA PANEL BY PCR (TYPE A & B)
Influenza A By PCR: NEGATIVE
Influenza B By PCR: NEGATIVE

## 2019-03-15 NOTE — ED Notes (Signed)
MD Robinson at bedside 

## 2019-03-15 NOTE — Discharge Instructions (Signed)
You have a viral illness which can have symptoms like muscle aches, fevers, chills, runny nose, cough, sneezing, sore throat, vomiting or diarrhea. One of the viruses that can cause this is SARS- CoV-2, the virus that causes COVID-19, also known as the novel coronavirus. You could also have a different viral infection such as the common cold or flu. Most patients with viral illness including COVID-19 have mild symptoms and recover on their own. Resting, staying hydrated, and sleeping are helpful. Today we think you are well enough to go home and treat your symptoms with oral liquids, and medicine for fevers, cough, and pain.  We generally do not do COVID-19 testing on people with mild symptoms who are being discharged from the Emergency Department or Clinic.   If we did a test for COVID-19 the results will not be available for several days. We will call you with the result. Please DO NOT CONTACT THE EMERGENCY DEPARTMENT OR CLINIC FOR RESULTS OF THIS TEST.   Please follow the precautions below:  Stay home for at least 7 days after your symptoms began OR for 3 days after your fever ends, whichever takes longer. ?  If people live with you they should also stay home and avoid contact with others for 14 days.?   ISOLATION GUIDANCE FOR POSSIBLE COVID Most people with cough and fever have an illness caused by a virus. One is COVID-19. Not all people with these infections are being tested for the virus that causes COVID-19. People who might have COVID but are not being tested should still try to prevent the spread of the infection.  These instructions are modified recommendations from the [place].  People who might have COVID-19 should follow the instructions below until a doctor or health department says they can stop.  Stay home unless you need to see a doctor Stop doing things outside your home except for getting medical care. Do not go to work, school,  or public areas; and do not use public transportation or taxis.  Call ahead before visiting the doctor Before your appointment, call the doctor's office and tell them about your symptoms. This will help them take steps to keep other people from getting infected.   Keep track of your symptoms Symptoms are the things you feel, like fever or trouble breathing. Go to your doctor or the ER if you think you are getting worse, like having more trouble breathing. Call the doctor's office and tell them about your symptoms. This will help them take steps to keep other people from getting sick.   Wear a face mask You should wear a face mask that covers your nose and mouth when you are in the same room with other people and when you visit the doctor's office. People who live with you or visit you should also wear a face mask when they are in the same room with you.  Separate yourself from other people in your home You should stay in a different room from other people in your home. You should stay separate from your family members as much as possible. You should use a separate bathroom if you can.  Avoid sharing things in your house Don't share dishes, drinking glasses, cups, eating utensils, towels, bedding, or other things with people in your home. After using these things please wash them really well with soap and water.  Cover your coughs and sneezes Cover your mouth and nose with a tissue when you  cough or sneeze, or you can cough or sneeze into your sleeve. Throw used tissues in a trash can that has a bag in it, and immediately wash your hands with soap and water for at least 20 seconds. If you use an alcohol-based hand rub please rub your hands together for 20 seconds.  Wash your [facility] your hands often and very well with soap and water for at least 20 seconds. If your hands are not visibly dirty you can use an alcohol-based hand sanitizer. Don't touch your eyes, nose, or mouth with unwashed  hands.    Instructions for People Helping Care for Patients with Possible COVID-19 Follow your doctor's instructions Make sure that you understand and can help the patient follow any instructions for care.  Provide for the patient's basic needs You should help the patient with basic needs in the home and provide support for getting groceries, prescriptions, and other personal needs.  Keep track of the patient's symptoms If they are getting sicker, call his or her doctor. This will help the doctor's office take steps to keep other people from getting infected.  Limit the number of people who have contact with the patient If possible, have only one caregiver for the patient. Other family members should stay in another home or place of residence. If they can't, they should stay in another room and stay separated from the patient as much as possible. Keep one bathroom JUST for the patient if you can.  Only allow visitors if they MUST be in the home.  Keep older adults, very young children, and other sick people away from the patient Keep older adults, very young children, and people who have compromised immune systems or chronic health conditions away from the patient. This includes people with chronic heart, lung, or kidney conditions, diabetes, and cancer.  Wash your hands often Avoid touching your eyes, nose, and mouth with unwashed hands. Wash your hands often and thoroughly with soap and water for at least 20 seconds. You can use an alcohol-based hand sanitizer if soap and water are not available and if your hands are not visibly dirty.  Use disposable paper towels to dry your hands. If not available, use dedicated cloth towels and replace them when they become wet.  Avoid contamination from face masks and gloves Wear a disposable face mask and gloves whenever you are touching the patient, things in their room or bathroom, or things that can have their body fluid on them, like bedding  or dishes, or blood, vomit, urine, or feces (poop).  Ensure the mask fits over your nose and mouth tightly, and do not touch it at all while you are wearing it. Throw out disposable facemasks and gloves after using them. Do not reuse. Wash your hands immediately after removing your facemask and gloves. If your personal clothing becomes dirty with a patient's body fluids, carefully remove clothing and launder. Wash your hands after handling dirty clothing. Place all used disposable facemasks, gloves, and other waste in a lined container before disposing them with other household garbage.  Remove gloves and wash your hands immediately after handling these items.  Do not share dishes, glasses, or other household items with the patient Avoid sharing household items. You should not share dishes, drinking glasses, cups, eating utensils, towels, bedding, or other items with a patient who is confirmed to have, or being evaluated for, COVID-19 infection.  After the person uses these items, you should wash them very well with soap and  water.  Wash laundry thoroughly Immediately remove and wash clothes or bedding that have blood, body fluids, and/or secretions or excretions, such as sweat, saliva, sputum, nasal mucus, vomit, urine, or feces, on them.  Wear gloves when handling laundry from the patient.  Read and follow directions on labels of laundry or clothing items and detergent. In general, wash and dry with the warmest temperatures recommended on the label.  Clean all areas the individual has used  Clean all touchable surfaces, such as counters, tabletops, doorknobs, bathroom fixtures, toilets, phones, keyboards, tablets, and bedside tables, every day. Also, clean any surfaces that may have blood, body fluids, and/or secretions or excretions on them. Wear gloves when cleaning surfaces the patient has come in contact with.  Use a diluted bleach solution (dilute bleach with 1 part bleach and 10 parts  water) or a household disinfectant with a label that says EPA-registered for coronaviruses. To make a bleach solution at home, add 1 tablespoon of bleach to 1 quart (4 cups) of water. For a larger supply, add  cup of bleach to 1 gallon (16 cups) of water.  Read labels of cleaning products and follow recommendations provided on product labels. Labels contain instructions for safe and effective use of the cleaning product including precautions you should take when applying the product, such as wearing gloves or eye protection and making sure you have good ventilation during use of the product.  Remove gloves and wash hands immediately after cleaning.  Monitor yourself for signs and symptoms of illness Caregivers and household members are considered close contacts, should monitor their health, and will be asked to limit movement outside of the home as much as possible.   If you have additional questions Nutritional therapist or Healthcare Provider [facility] website ([email or web address]) The [place] hotline 336-70COVID Your local health department   This guidance is subject to change. For the most up to date guidance, check the state Department of Health website at [email or web address]

## 2019-03-16 LAB — NOVEL CORONAVIRUS, NAA (HOSP ORDER, SEND-OUT TO REF LAB; TAT 18-24 HRS): SARS-CoV-2, NAA: NOT DETECTED

## 2019-03-23 ENCOUNTER — Telehealth: Payer: Self-pay | Admitting: Emergency Medicine

## 2019-03-23 NOTE — Telephone Encounter (Signed)
Called patient and informed her of negative covid 19 result.

## 2019-05-06 IMAGING — DX PORTABLE CHEST - 1 VIEW
1 series · 1 of 1 positions shown · non-contrast
Comparison: Prior radiograph from 09/21/2015.

CLINICAL DATA: Initial evaluation for acute generalized body aches.
COVID 19 exposure.

EXAM:
PORTABLE CHEST 1 VIEW

[chest ap]
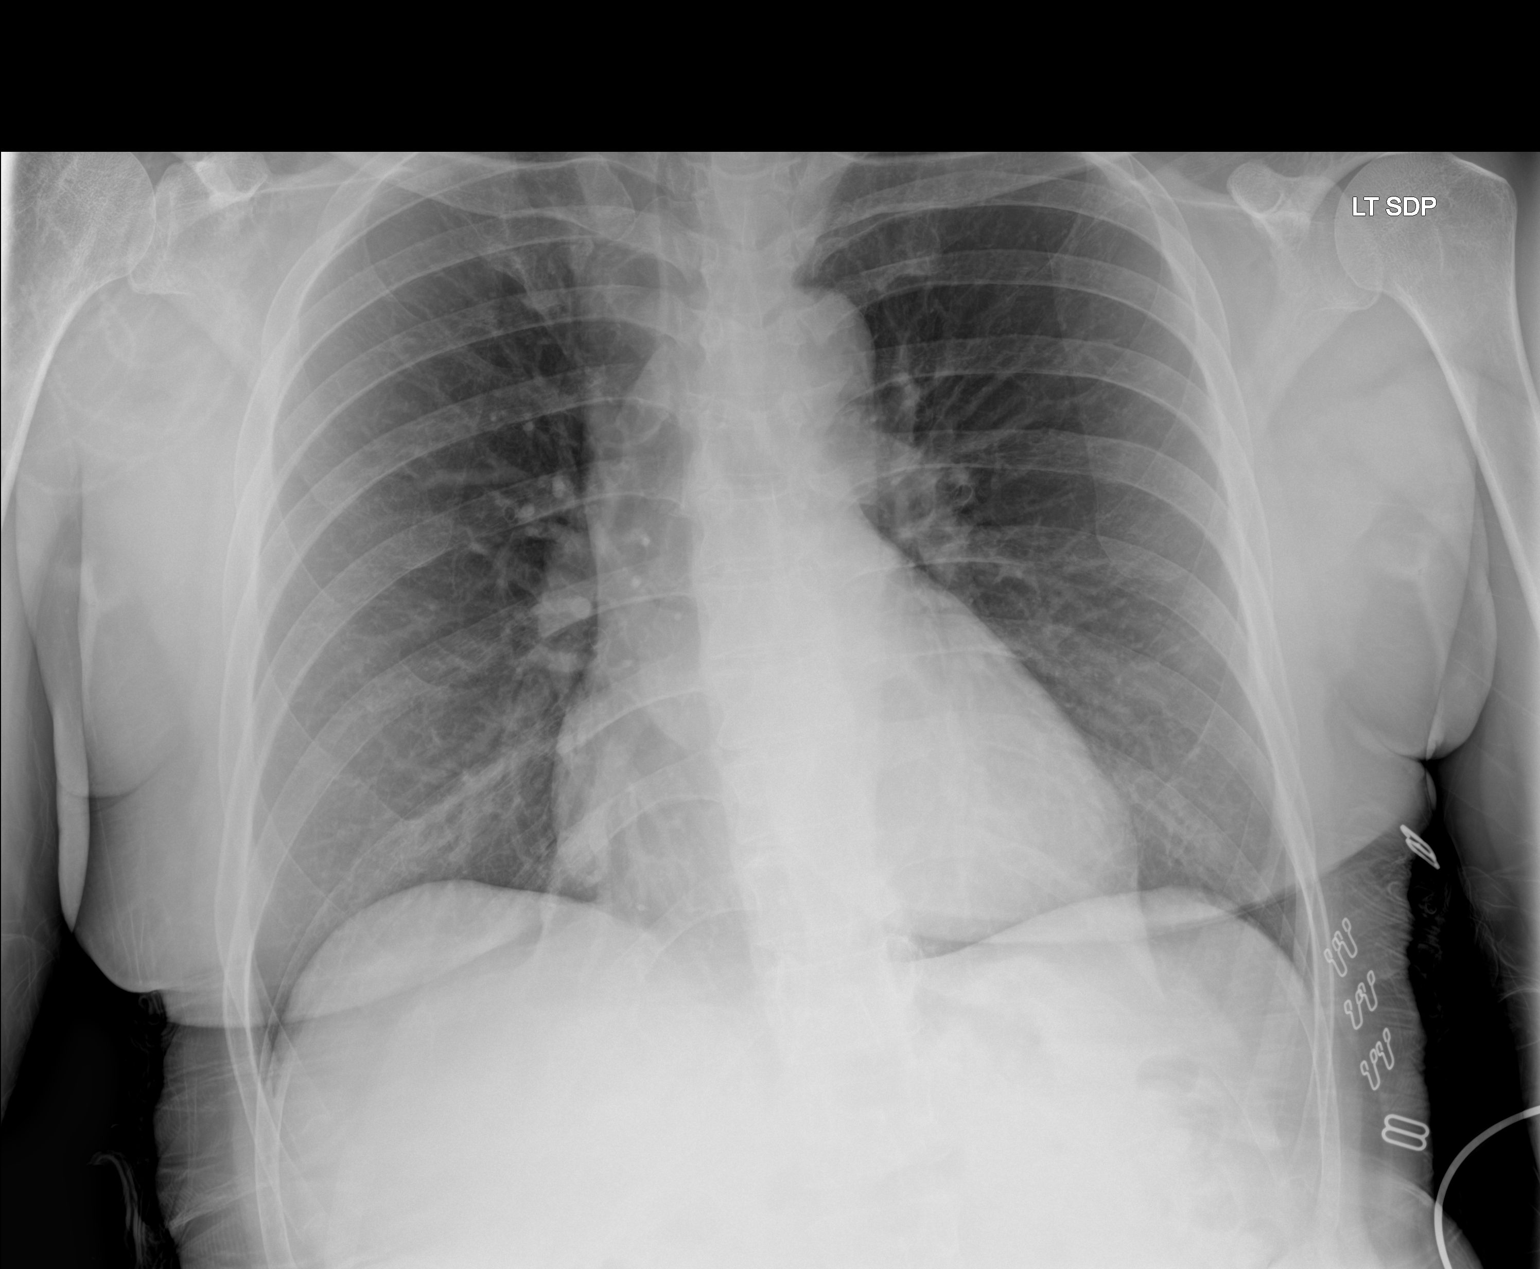

[1 of 1 positions shown; findings below may reference images not displayed]

FINDINGS: The cardiac and mediastinal silhouettes are stable in size and
contour, and remain within normal limits.

The lungs are normally inflated. No airspace consolidation, pleural
effusion, or pulmonary edema is identified. There is no
pneumothorax.

No acute osseous abnormality identified.
IMPRESSION: No radiographic evidence for active cardiopulmonary disease.

## 2019-12-20 ENCOUNTER — Other Ambulatory Visit: Payer: Self-pay

## 2019-12-20 ENCOUNTER — Ambulatory Visit
Admission: EM | Admit: 2019-12-20 | Discharge: 2019-12-20 | Disposition: A | Discharge status: Home / Self Care | Payer: 59 | Attending: Emergency Medicine | Admitting: Emergency Medicine

## 2019-12-20 ENCOUNTER — Encounter: Payer: Self-pay | Admitting: Emergency Medicine

## 2019-12-20 DIAGNOSIS — Z20822 Contact with and (suspected) exposure to covid-19: Secondary | ICD-10-CM

## 2019-12-20 DIAGNOSIS — H6012 Cellulitis of left external ear: Secondary | ICD-10-CM

## 2019-12-20 DIAGNOSIS — U071 COVID-19: Secondary | ICD-10-CM | POA: Diagnosis not present

## 2019-12-20 DIAGNOSIS — J069 Acute upper respiratory infection, unspecified: Secondary | ICD-10-CM | POA: Insufficient documentation

## 2019-12-20 MED ORDER — BENZONATATE 200 MG PO CAPS: 200.0000 mg | ORAL_CAPSULE | Freq: Three times a day (TID) | ORAL | 0 refills | Status: AC | PRN

## 2019-12-20 MED ORDER — FLUTICASONE PROPIONATE 50 MCG/ACT NA SUSP: 2.0000 | Freq: Every day | NASAL | 0 refills | Status: AC

## 2019-12-20 MED ORDER — IBUPROFEN 600 MG PO TABS: 600.0000 mg | ORAL_TABLET | Freq: Four times a day (QID) | ORAL | 0 refills | Status: AC | PRN

## 2019-12-20 MED ORDER — DOXYCYCLINE HYCLATE 100 MG PO CAPS: 100.0000 mg | ORAL_CAPSULE | Freq: Two times a day (BID) | ORAL | 0 refills | Status: AC

## 2019-12-20 NOTE — Discharge Instructions (Addendum)
Take 600 mg of ibuprofen combined with 1 g of Tylenol 3-4 times a day as needed for pain.  The doxycycline will take care of the infection in your ear.  Start some warm compresses.  Please try to not pick at your ear.  Discontinue Claritin.  Start Mucinex or Mucinex D.  Start some Robitussin/dextromethorphan for the cough.  I was unable to prescribe you the Tussionex because you are on OxyContin Tessalon should help as well.  Start saline nasal irrigation with a Milta Deiters med rinse and distilled water as often as you want for the nasal congestion, postnasal drip.  Flonase.

## 2019-12-20 NOTE — ED Triage Notes (Signed)
Patient c/o bilateral ear pain and cough that started a week ago.  Patient states that the pain in her ear has gotten worse.  Patient denies fevers.

## 2019-12-20 NOTE — ED Provider Notes (Signed)
HPI  SUBJECTIVE:  Theresa Cobb is a 54 y.o. female who presents with a "cold" for the past 6 days.  She reports body aches, nasal congestion, sore throat, postnasal drip, sinus pain or pressure, productive cough, fatigue.  She reports altered sense of taste, but no loss of sense of smell.  No fevers, wheezing, chest pain, shortness of breath, headaches, nausea, vomiting, diarrhea, abdominal pain.  No Covid or flu exposure.  She got a flu shot this year.  No upper dental pain, facial swelling.  Unable to sleep at night secondary to the cough.  She also reports bilateral ear pain on the left more so than the right.  No change in hearing.  She reports purulent drainage coming from the left ear.  Denies foreign body insertion, but states that she has been "picking" at her ear with her fingers.  No facial rash.  No antipyretic in the past 4 to 6 hours.  She tried Mucinex, Advil with Sudafed 1 tab twice daily, Claritin without improvement in her symptoms.  No other aggravating or alleviating factors.  Past medical history negative for pulmonary disease, smoking, diabetes, coronary disease, hypertension, HIV, cancer, immunocompromise, chronic kidney disease, shingles, MRSA.  PMD: [facility].    Past Medical History:  Diagnosis Date  . Back problem   . 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 Family history of malignant neoplasm of breast    mother had breast cancer  . Palpitations     Past Surgical History:  Procedure Laterality Date  . BACK SURGERY    . BREAST BIOPSY Right 2012   neg  . BREAST MASS EXCISION Right 2012  . LUMBAR EPIDURAL INJECTION  2012   injection in the back    Family History  Problem Relation Age of Onset  . Breast cancer Mother 50    Social History   Tobacco Use  . Smoking status: Never Smoker  . Smokeless tobacco: Never Used  Substance Use Topics  . Alcohol use: No    Alcohol/week: 0.0 standard drinks  . Drug use: No     Current  Facility-Administered Medications:  .  bupivacaine (PF) (MARCAINE) 0.25 % injection 30 mL, 30 mL, Other, Once, Mohammed Kindle, MD .  bupivacaine (PF) (MARCAINE) 0.25 % injection 30 mL, 30 mL, Other, Once, Mohammed Kindle, MD .  bupivacaine (PF) (MARCAINE) 0.25 % injection 30 mL, 30 mL, Other, Once, Mohammed Kindle, MD .  ceFAZolin (ANCEF) IVPB 1 g/50 mL premix, 1 g, Intravenous, Once, Mohammed Kindle, MD .  ceFAZolin (ANCEF) IVPB 1 g/50 mL premix, 1 g, Intravenous, Once, Mohammed Kindle, MD .  ceFAZolin (ANCEF) IVPB 1 g/50 mL premix, 1 g, Intravenous, Once, Mohammed Kindle, MD .  fentaNYL (SUBLIMAZE) injection 100 mcg, 100 mcg, Intravenous, Once, Mohammed Kindle, MD .  fentaNYL (SUBLIMAZE) injection 100 mcg, 100 mcg, Intravenous, Once, Mohammed Kindle, MD .  fentaNYL (SUBLIMAZE) injection 100 mcg, 100 mcg, Intravenous, Once, Mohammed Kindle, MD .  lactated ringers infusion 1,000 mL, 1,000 mL, Intravenous, Continuous, Mohammed Kindle, MD .  lactated ringers infusion 1,000 mL, 1,000 mL, Intravenous, Continuous, Mohammed Kindle, MD .  lactated ringers infusion 1,000 mL, 1,000 mL, Intravenous, Continuous, Mohammed Kindle, MD .  lactated ringers infusion 1,000 mL, 1,000 mL, Intravenous, Continuous, Mohammed Kindle, MD, Last Rate: 125 mL/hr at 03/26/16 0944, 1,000 mL at 03/26/16 0944 .  lactated ringers infusion 1,000 mL, 1,000 mL, Intravenous, Continuous, Mohammed Kindle, MD .  lactated ringers infusion 1,000 mL, 1,000  mL, Intravenous, Continuous, Mohammed Kindle, MD .  lidocaine (PF) (XYLOCAINE) 1 % injection 10 mL, 10 mL, Subcutaneous, Once, Mohammed Kindle, MD .  midazolam (VERSED) 5 MG/5ML injection 5 mg, 5 mg, Intravenous, Once, Mohammed Kindle, MD .  midazolam (VERSED) 5 MG/5ML injection 5 mg, 5 mg, Intravenous, Once, Mohammed Kindle, MD .  midazolam (VERSED) 5 MG/5ML injection 5 mg, 5 mg, Intravenous, Once, Mohammed Kindle, MD .  midazolam (VERSED) 5 MG/5ML injection 5 mg, 5 mg, Intravenous, Once, Mohammed Kindle, MD .  orphenadrine (NORFLEX) injection 60 mg, 60 mg, Intramuscular, Once, Mohammed Kindle, MD .  orphenadrine (NORFLEX) injection 60 mg, 60 mg, Intramuscular, Once, Mohammed Kindle, MD .  orphenadrine (NORFLEX) injection 60 mg, 60 mg, Intramuscular, Once, Mohammed Kindle, MD .  orphenadrine (NORFLEX) injection 60 mg, 60 mg, Intramuscular, Once, Mohammed Kindle, MD .  triamcinolone acetonide (KENALOG-40) injection 40 mg, 40 mg, Other, Once, Mohammed Kindle, MD .  triamcinolone acetonide (KENALOG-40) injection 40 mg, 40 mg, Other, Once, Mohammed Kindle, MD .  triamcinolone acetonide (KENALOG-40) injection 40 mg, 40 mg, Other, Once, Mohammed Kindle, MD  Current Outpatient Medications:  .  diclofenac (FLECTOR) 1.3 % PTCH, Apply 1 patch to skin twice a day if tolerated, Disp: 60 patch, Rfl: 2 .  diclofenac sodium (VOLTAREN) 1 % GEL, Apply 2-4 grams to painful areas 4 times per day if tolerated, Disp: 500 g, Rfl: 2 .  DULoxetine (CYMBALTA) 30 MG capsule, 1-2 tablets by mouth daily if tolerated, Disp: 60 capsule, Rfl: 2 .  gabapentin (NEURONTIN) 400 MG capsule, Limit 6 - 9 tabs po per day if tolerated, Disp: 270 capsule, Rfl: 2 .  lidocaine (LIDODERM) 5 %, Limit applying 1-3 patches to painful areas of skin for 12 hours then remove for 12 hours and repeat process if tolerated., Disp: 90 patch, Rfl: 2 .  medroxyPROGESTERone (DEPO-PROVERA) 150 MG/ML injection, Inject 150 mg into the muscle every 3 (three) months., Disp: , Rfl:  .  oxycodone (OXY-IR) 5 MG capsule, Limit 1 tab by mouth 3-6 times per day if tolerated (Patient taking differently: 10 mg. Limit 1 tab by mouth 3 per day if tolerated), Disp: 180 capsule, Rfl: 0 .  Oxycodone HCl 10 MG TABS, Take 10 mg by mouth 3 (three) times daily., Disp: , Rfl:  .  OXYCONTIN 10 MG 12 hr tablet, Take 10 mg by mouth 2 (two) times daily., Disp: , Rfl:  .  tizanidine (ZANAFLEX) 2 MG capsule, Limit 1 - 2 tabs by mouth 2 to 3 times a day if tolerated (Please provide  patient with tab form of medication), Disp: 180 capsule, Rfl: 2 .  benzonatate (TESSALON) 200 MG capsule, Take 1 capsule (200 mg total) by mouth 3 (three) times daily as needed for cough., Disp: 30 capsule, Rfl: 0 .  cefUROXime (CEFTIN) 250 MG tablet, Take 1 tablet (250 mg total) by mouth 2 (two) times daily with a meal. (Patient not taking: Reported on 06/25/2016), Disp: 14 tablet, Rfl: 0 .  doxycycline (VIBRAMYCIN) 100 MG capsule, Take 1 capsule (100 mg total) by mouth 2 (two) times daily for 7 days., Disp: 14 capsule, Rfl: 0 .  fluticasone (FLONASE) 50 MCG/ACT nasal spray, Place 2 sprays into both nostrils daily., Disp: 16 g, Rfl: 0 .  ibuprofen (ADVIL) 600 MG tablet, Take 1 tablet (600 mg total) by mouth every 6 (six) hours as needed., Disp: 30 tablet, Rfl: 0 .  oxycodone (OXY-IR) 5 MG capsule, Limit 1 tab by mouth 3-6 times per  day if tolerated, Disp: 180 capsule, Rfl: 0  Allergies  Allergen Reactions  . No Known Allergies      ROS  As noted in HPI.   Physical Exam  BP 113/74 (BP Location: Right Arm)   Pulse 98   Temp 98.7 F (37.1 C) (Oral)   Resp 14   Ht 5\' 4"  (1.626 m)   Wt 81.6 kg   SpO2 98%   BMI 30.90 kg/m   Constitutional: Well developed, well nourished, no acute distress Eyes: PERRL, EOMI, conjunctiva normal bilaterally HENT: Normocephalic, atraumatic,mucus membranes moist.  Left ear: Left external ear normal.  Pain with traction on pinna, palpation of tragus.  No tenderness over the mastoid.  Positive tender swollen area of erythema without any expressible purulent drainage in the 12 o'clock position of the left external ear canal.  Left TM normal. Right ear: Right external ear, ear canal, TM normal.  No pain with traction on pinna, palpation of tragus, no mastoid tenderness.  Positive erythematous, swollen turbinates with clear nasal congestion.  No maxillary or frontal sinus tenderness.   extensive postnasal drip.  Normal tonsils without exudates.  Otherwise normal  oropharynx.  Uvula midline. Neck: Positive left-sided cervical lymphadenopathy Respiratory: Clear to auscultation bilaterally, no rales, no wheezing, no rhonchi.  Positive anterior chest wall tenderness. Cardiovascular: Normal rate and rhythm, no murmurs, no gallops, no rubs GI: Soft, nondistended, normal bowel sounds, nontender, no rebound, no guarding.  No splenomegaly Back: no CVAT skin: No facial rash, skin intact Musculoskeletal: No edema, no tenderness, no deformities Neurologic: Alert & oriented x 3, CN III-XII grossly intact, no motor deficits, sensation grossly intact Psychiatric: Speech and behavior appropriate   ED Course   Medications - No data to display  Orders Placed This Encounter  Procedures  . Novel Coronavirus, N[facility] (Hosp order, Send-out to Ref Lab; TAT 18-24 hrs    Standing Status:   Standing    Number of Occurrences:   1    Order Specific Question:   Is this test for diagnosis or screening    Answer:   Screening    Order Specific Question:   Symptomatic for COVID-19 as defined by CDC    Answer:   Yes    Order Specific Question:   Date of Symptom Onset    Answer:   12/14/2019    Order Specific Question:   Hospitalized for COVID-19    Answer:   No    Order Specific Question:   Admitted to ICU for COVID-19    Answer:   No    Order Specific Question:   Previously tested for COVID-19    Answer:   No    Order Specific Question:   Resident in a congregate (group) care setting    Answer:   No    Order Specific Question:   Employed in healthcare setting    Answer:   No    Order Specific Question:   Pregnant    Answer:   No   No results found for this or any previous visit (from the past 24 hour(s)). No results found.  ED Clinical Impression  1. Cellulitis of ear canal, left   2. Viral URI with cough   3. Encounter for laboratory testing for COVID-19 virus      ED Assessment/Plan  1.  Otalgia.  Appears to have a cellulitis/small abscess in the left  external ear canal.  We will have her start warm compresses and send her home with 1  week of doxycycline  2.  Cough.  Suspect it is from the postnasal drip.  Altered sense of smell, this could be Covid.  Doubt influenza.  Covid PCR sent.  Sending home with saline nasal irrigation, Flonase, Tessalon,  ibuprofen 600 mg combined with 1 g of Tylenol 3-4 times a day as needed, continue Mucinex D start dextromethorphan/Robitussin for cough..  Follow-up with PMD as needed.  To the ER if she gets worse.  [place] Narcotic database reviewed for this patient,.  Patient has been getting OxyContin from the same provider for the past year.  Last OxyContin prescription 12/17/2019.  Discussed  MDM, treatment plan, and plan for follow-up with patient Discussed sn/sx that should prompt return to the ED. patient agrees with plan.   Meds ordered this encounter  Medications  . benzonatate (TESSALON) 200 MG capsule    Sig: Take 1 capsule (200 mg total) by mouth 3 (three) times daily as needed for cough.    Dispense:  30 capsule    Refill:  0  . ibuprofen (ADVIL) 600 MG tablet    Sig: Take 1 tablet (600 mg total) by mouth every 6 (six) hours as needed.    Dispense:  30 tablet    Refill:  0  . fluticasone (FLONASE) 50 MCG/ACT nasal spray    Sig: Place 2 sprays into both nostrils daily.    Dispense:  16 g    Refill:  0  . doxycycline (VIBRAMYCIN) 100 MG capsule    Sig: Take 1 capsule (100 mg total) by mouth 2 (two) times daily for 7 days.    Dispense:  14 capsule    Refill:  0    *This clinic note was created using [employer]. Therefore, there may be occasional mistakes despite careful proofreading.  ?    Melynda Ripple, MD 12/20/19 [phone]

## 2019-12-21 ENCOUNTER — Telehealth (HOSPITAL_COMMUNITY): Payer: Self-pay | Admitting: Emergency Medicine

## 2019-12-21 LAB — NOVEL CORONAVIRUS, NAA (HOSP ORDER, SEND-OUT TO REF LAB; TAT 18-24 HRS): SARS-CoV-2, NAA: DETECTED — AB

## 2019-12-21 NOTE — Telephone Encounter (Signed)
Your test for COVID-19 was positive, meaning that you were infected with the novel coronavirus and could give the germ to others.  Please continue isolation at home for at least 10 days since the start of your symptoms. If you do not have symptoms, please isolate at home for 10 days from the day you were tested. Once you complete your 10 day quarantine, you may return to normal activities as long as you've not had a fever for over 24 hours(without taking fever reducing medicine) and your symptoms are improving. Please continue good preventive care measures, including:  frequent hand-washing, avoid touching your face, cover coughs/sneezes, stay out of crowds and keep a 6 foot distance from others.  Go to the nearest hospital emergency room if fever/cough/breathlessness are severe or illness seems like a threat to life.  Attempted to reach patient. No answer at this time. Voicemail left.

## 2019-12-23 ENCOUNTER — Emergency Department
Admission: EM | Admit: 2019-12-23 | Discharge: 2019-12-23 | Disposition: A | Discharge status: Home / Self Care | Payer: 59 | Attending: Emergency Medicine | Admitting: Emergency Medicine

## 2019-12-23 ENCOUNTER — Telehealth (HOSPITAL_COMMUNITY): Payer: Self-pay | Admitting: Emergency Medicine

## 2019-12-23 ENCOUNTER — Other Ambulatory Visit: Payer: Self-pay

## 2019-12-23 DIAGNOSIS — U071 COVID-19: Secondary | ICD-10-CM | POA: Diagnosis present

## 2019-12-23 DIAGNOSIS — Z5321 Procedure and treatment not carried out due to patient leaving prior to being seen by health care provider: Secondary | ICD-10-CM | POA: Diagnosis not present

## 2019-12-23 NOTE — ED Notes (Signed)
Jonni Sanger tech called for the pt in the [place] with no response.

## 2019-12-23 NOTE — Telephone Encounter (Signed)
Attempted to reach patient x2. No answer at this time. Voicemail left.   Called emergency contact, mother answered, would try to connect with pt to contact me. Letter sent.

## 2019-12-23 NOTE — ED Notes (Signed)
Claudie Fisherman called for pt in the [place] with no response

## 2019-12-23 NOTE — Telephone Encounter (Signed)
Pt returned my call, informed her of her results. Pt states she feels very bad and is going to go to the ER.

## 2019-12-23 NOTE — ED Notes (Signed)
Pt called in the [facility] with no response 

## 2019-12-23 NOTE — ED Triage Notes (Signed)
Pt to the er for covid. Pt was tested elsewhere and told to day she had covid. Pt states she hurts all over and she cant breathe. Pt reports being unable to walk. Pt vitals are all WNL.

## 2019-12-29 ENCOUNTER — Telehealth: Payer: Self-pay | Admitting: Nurse Practitioner
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
## 2021-05-11 ENCOUNTER — Other Ambulatory Visit: Payer: Self-pay

## 2021-05-11 ENCOUNTER — Ambulatory Visit
Admission: EM | Admit: 2021-05-11 | Discharge: 2021-05-11 | Disposition: A | Discharge status: Home / Self Care | Payer: 59 | Attending: Physician Assistant | Admitting: Physician Assistant

## 2021-05-11 ENCOUNTER — Encounter: Payer: Self-pay | Admitting: Emergency Medicine

## 2021-05-11 DIAGNOSIS — Z79899 Other long term (current) drug therapy: Secondary | ICD-10-CM | POA: Insufficient documentation

## 2021-05-11 DIAGNOSIS — Z20822 Contact with and (suspected) exposure to covid-19: Secondary | ICD-10-CM | POA: Insufficient documentation

## 2021-05-11 DIAGNOSIS — H1033 Unspecified acute conjunctivitis, bilateral: Secondary | ICD-10-CM

## 2021-05-11 DIAGNOSIS — R0981 Nasal congestion: Secondary | ICD-10-CM

## 2021-05-11 MED ORDER — NAPHAZOLINE-PHENIRAMINE 0.025-0.3 % OP SOLN: 2.0000 [drp] | Freq: Four times a day (QID) | OPHTHALMIC | 0 refills | Status: AC | PRN

## 2021-05-11 MED ORDER — POLYMYXIN B-TRIMETHOPRIM 10000-0.1 UNIT/ML-% OP SOLN: 1.0000 [drp] | Freq: Four times a day (QID) | OPHTHALMIC | 0 refills | Status: AC

## 2021-05-11 NOTE — ED Triage Notes (Signed)
PT reports bilateral eye redness, itching, and drainage for 2-3 days. Slight headache.

## 2021-05-11 NOTE — Discharge Instructions (Signed)
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
## 2021-05-12 LAB — SARS CORONAVIRUS 2 (TAT 6-24 HRS): SARS Coronavirus 2: NEGATIVE

## 2021-07-05 ENCOUNTER — Encounter: Payer: Self-pay | Admitting: Emergency Medicine

## 2021-07-05 ENCOUNTER — Ambulatory Visit
Admission: EM | Admit: 2021-07-05 | Discharge: 2021-07-05 | Disposition: A | Discharge status: Home / Self Care | Payer: Self-pay | Attending: Physician Assistant | Admitting: Physician Assistant

## 2021-07-05 ENCOUNTER — Other Ambulatory Visit: Payer: Self-pay

## 2021-07-05 DIAGNOSIS — Z20822 Contact with and (suspected) exposure to covid-19: Secondary | ICD-10-CM | POA: Insufficient documentation

## 2021-07-05 DIAGNOSIS — R112 Nausea with vomiting, unspecified: Secondary | ICD-10-CM | POA: Insufficient documentation

## 2021-07-05 DIAGNOSIS — R519 Headache, unspecified: Secondary | ICD-10-CM | POA: Insufficient documentation

## 2021-07-05 MED ORDER — ONDANSETRON 4 MG PO TBDP
4.0000 mg | ORAL_TABLET | Freq: Once | ORAL | Status: AC
  Administered 2021-07-05: 4 mg via ORAL

## 2021-07-05 MED ORDER — ONDANSETRON 8 MG PO TBDP: 8.0000 mg | ORAL_TABLET | Freq: Three times a day (TID) | ORAL | 0 refills | Status: AC | PRN

## 2021-07-05 MED ORDER — KETOROLAC TROMETHAMINE 60 MG/2ML IM SOLN
30.0000 mg | Freq: Once | INTRAMUSCULAR | Status: AC
  Administered 2021-07-05: 30 mg via INTRAMUSCULAR

## 2021-07-05 NOTE — ED Triage Notes (Signed)
Pt presents today with headache and n/v x 2-3 days. She has vomited 3 x in last 24 hours. Denies diarrhea or fever. Denies dysuria.

## 2021-07-05 NOTE — Discharge Instructions (Addendum)
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
## 2021-07-06 LAB — SARS CORONAVIRUS 2 (TAT 6-24 HRS): SARS Coronavirus 2: NEGATIVE

## 2022-05-10 ENCOUNTER — Ambulatory Visit: Payer: Medicaid Other

## 2022-08-23 ENCOUNTER — Other Ambulatory Visit: Payer: Self-pay

## 2022-08-23 DIAGNOSIS — Z1231 Encounter for screening mammogram for malignant neoplasm of breast: Secondary | ICD-10-CM

## 2022-08-27 ENCOUNTER — Other Ambulatory Visit: Payer: Self-pay | Admitting: Family Medicine

## 2022-08-27 ENCOUNTER — Ambulatory Visit: Payer: 59

## 2022-08-27 ENCOUNTER — Ambulatory Visit: Payer: Medicaid Other

## 2022-08-27 ENCOUNTER — Ambulatory Visit
Admission: RE | Admit: 2022-08-27 | Discharge: 2022-08-27 | Disposition: A | Discharge status: Home / Self Care | Payer: 59 | Source: Ambulatory Visit | Attending: Family Medicine | Admitting: Family Medicine

## 2022-08-27 DIAGNOSIS — Z1231 Encounter for screening mammogram for malignant neoplasm of breast: Secondary | ICD-10-CM | POA: Insufficient documentation

## 2022-08-28 ENCOUNTER — Other Ambulatory Visit: Payer: Self-pay | Admitting: Family Medicine

## 2022-08-31 ENCOUNTER — Other Ambulatory Visit: Payer: Self-pay | Admitting: Family Medicine

## 2022-08-31 DIAGNOSIS — R928 Other abnormal and inconclusive findings on diagnostic imaging of breast: Secondary | ICD-10-CM

## 2022-08-31 DIAGNOSIS — R921 Mammographic calcification found on diagnostic imaging of breast: Secondary | ICD-10-CM

## 2022-09-11 ENCOUNTER — Other Ambulatory Visit: Payer: Self-pay | Admitting: Family Medicine

## 2022-09-11 ENCOUNTER — Ambulatory Visit
Admission: RE | Admit: 2022-09-11 | Discharge: 2022-09-11 | Disposition: A | Discharge status: Home / Self Care | Payer: 59 | Source: Ambulatory Visit | Attending: Family Medicine | Admitting: Family Medicine

## 2022-09-11 DIAGNOSIS — R921 Mammographic calcification found on diagnostic imaging of breast: Secondary | ICD-10-CM

## 2022-09-11 DIAGNOSIS — R928 Other abnormal and inconclusive findings on diagnostic imaging of breast: Secondary | ICD-10-CM

## 2022-09-12 ENCOUNTER — Other Ambulatory Visit: Payer: Self-pay | Admitting: Family Medicine

## 2022-09-12 DIAGNOSIS — R928 Other abnormal and inconclusive findings on diagnostic imaging of breast: Secondary | ICD-10-CM

## 2022-09-12 DIAGNOSIS — R921 Mammographic calcification found on diagnostic imaging of breast: Secondary | ICD-10-CM

## 2022-09-19 DIAGNOSIS — M961 Postlaminectomy syndrome, not elsewhere classified: Secondary | ICD-10-CM | POA: Diagnosis not present

## 2022-09-19 DIAGNOSIS — Z5181 Encounter for therapeutic drug level monitoring: Secondary | ICD-10-CM | POA: Diagnosis not present

## 2022-09-19 DIAGNOSIS — Z79891 Long term (current) use of opiate analgesic: Secondary | ICD-10-CM | POA: Diagnosis not present

## 2022-09-19 DIAGNOSIS — M5136 Other intervertebral disc degeneration, lumbar region: Secondary | ICD-10-CM | POA: Diagnosis not present

## 2022-09-19 DIAGNOSIS — G894 Chronic pain syndrome: Secondary | ICD-10-CM | POA: Diagnosis not present

## 2022-09-19 DIAGNOSIS — R202 Paresthesia of skin: Secondary | ICD-10-CM | POA: Diagnosis not present

## 2022-09-19 DIAGNOSIS — M5451 Vertebrogenic low back pain: Secondary | ICD-10-CM | POA: Diagnosis not present

## 2022-09-19 DIAGNOSIS — M1990 Unspecified osteoarthritis, unspecified site: Secondary | ICD-10-CM | POA: Diagnosis not present

## 2022-09-19 DIAGNOSIS — M62830 Muscle spasm of back: Secondary | ICD-10-CM | POA: Diagnosis not present

## 2022-09-19 DIAGNOSIS — M5416 Radiculopathy, lumbar region: Secondary | ICD-10-CM | POA: Diagnosis not present

## 2022-09-19 DIAGNOSIS — M79606 Pain in leg, unspecified: Secondary | ICD-10-CM | POA: Diagnosis not present

## 2022-09-19 DIAGNOSIS — M545 Low back pain, unspecified: Secondary | ICD-10-CM | POA: Diagnosis not present

## 2022-09-26 ENCOUNTER — Ambulatory Visit
Admission: RE | Admit: 2022-09-26 | Discharge: 2022-09-26 | Disposition: A | Discharge status: Home / Self Care | Payer: 59 | Source: Ambulatory Visit | Attending: Family Medicine | Admitting: Family Medicine

## 2022-09-26 DIAGNOSIS — R921 Mammographic calcification found on diagnostic imaging of breast: Secondary | ICD-10-CM | POA: Insufficient documentation

## 2022-09-26 DIAGNOSIS — R928 Other abnormal and inconclusive findings on diagnostic imaging of breast: Secondary | ICD-10-CM | POA: Diagnosis not present

## 2022-09-26 DIAGNOSIS — N6081 Other benign mammary dysplasias of right breast: Secondary | ICD-10-CM | POA: Diagnosis not present

## 2022-09-26 HISTORY — PX: BREAST BIOPSY: SHX20

## 2022-09-26 MED ORDER — LIDOCAINE HCL (PF) 2 % IJ SOLN
10.0000 mL | Freq: Once | INTRAMUSCULAR | Status: AC
  Administered 2022-09-26: 10 mL

## 2022-09-26 MED ORDER — LIDOCAINE-EPINEPHRINE 1 %-1:100000 IJ SOLN
20.0000 mL | Freq: Once | INTRAMUSCULAR | Status: AC
  Administered 2022-09-26: 20 mL

## 2022-09-27 LAB — SURGICAL PATHOLOGY

## 2022-10-17 DIAGNOSIS — G894 Chronic pain syndrome: Secondary | ICD-10-CM | POA: Diagnosis not present

## 2022-10-17 DIAGNOSIS — M1990 Unspecified osteoarthritis, unspecified site: Secondary | ICD-10-CM | POA: Diagnosis not present

## 2022-10-17 DIAGNOSIS — M5489 Other dorsalgia: Secondary | ICD-10-CM | POA: Diagnosis not present

## 2022-10-17 DIAGNOSIS — R202 Paresthesia of skin: Secondary | ICD-10-CM | POA: Diagnosis not present

## 2022-10-17 DIAGNOSIS — M961 Postlaminectomy syndrome, not elsewhere classified: Secondary | ICD-10-CM | POA: Diagnosis not present

## 2022-10-17 DIAGNOSIS — M545 Low back pain, unspecified: Secondary | ICD-10-CM | POA: Diagnosis not present

## 2022-10-17 DIAGNOSIS — M62831 Muscle spasm of calf: Secondary | ICD-10-CM | POA: Diagnosis not present

## 2022-10-17 DIAGNOSIS — M5136 Other intervertebral disc degeneration, lumbar region: Secondary | ICD-10-CM | POA: Diagnosis not present

## 2022-10-31 DIAGNOSIS — Z803 Family history of malignant neoplasm of breast: Secondary | ICD-10-CM | POA: Diagnosis not present

## 2022-10-31 DIAGNOSIS — K219 Gastro-esophageal reflux disease without esophagitis: Secondary | ICD-10-CM | POA: Diagnosis not present

## 2022-10-31 DIAGNOSIS — R030 Elevated blood-pressure reading, without diagnosis of hypertension: Secondary | ICD-10-CM | POA: Diagnosis not present

## 2022-10-31 DIAGNOSIS — Z79891 Long term (current) use of opiate analgesic: Secondary | ICD-10-CM | POA: Diagnosis not present

## 2022-10-31 DIAGNOSIS — Z833 Family history of diabetes mellitus: Secondary | ICD-10-CM | POA: Diagnosis not present

## 2022-10-31 DIAGNOSIS — R69 Illness, unspecified: Secondary | ICD-10-CM | POA: Diagnosis not present

## 2022-10-31 DIAGNOSIS — Z791 Long term (current) use of non-steroidal anti-inflammatories (NSAID): Secondary | ICD-10-CM | POA: Diagnosis not present

## 2022-10-31 DIAGNOSIS — G8929 Other chronic pain: Secondary | ICD-10-CM | POA: Diagnosis not present

## 2022-11-01 DIAGNOSIS — M4316 Spondylolisthesis, lumbar region: Secondary | ICD-10-CM | POA: Diagnosis not present

## 2022-11-01 DIAGNOSIS — Z981 Arthrodesis status: Secondary | ICD-10-CM | POA: Diagnosis not present

## 2023-07-02 ENCOUNTER — Other Ambulatory Visit: Payer: Self-pay | Admitting: Family Medicine

## 2023-07-02 DIAGNOSIS — Z1231 Encounter for screening mammogram for malignant neoplasm of breast: Secondary | ICD-10-CM

## 2023-09-04 ENCOUNTER — Ambulatory Visit
Admission: RE | Admit: 2023-09-04 | Discharge: 2023-09-04 | Disposition: A | Discharge status: Home / Self Care | Payer: Medicare PPO | Source: Ambulatory Visit | Attending: Family Medicine | Admitting: Family Medicine

## 2023-09-04 DIAGNOSIS — Z1231 Encounter for screening mammogram for malignant neoplasm of breast: Secondary | ICD-10-CM | POA: Diagnosis present

## 2024-07-27 ENCOUNTER — Other Ambulatory Visit: Payer: Self-pay

## 2024-09-28 ENCOUNTER — Other Ambulatory Visit: Payer: Self-pay | Admitting: Family Medicine

## 2024-09-28 DIAGNOSIS — Z1231 Encounter for screening mammogram for malignant neoplasm of breast: Secondary | ICD-10-CM

## 2024-11-03 ENCOUNTER — Ambulatory Visit
Admission: RE | Admit: 2024-11-03 | Discharge: 2024-11-03 | Disposition: A | Discharge status: Home / Self Care | Source: Ambulatory Visit | Attending: Family Medicine | Admitting: Family Medicine

## 2024-11-03 DIAGNOSIS — Z1231 Encounter for screening mammogram for malignant neoplasm of breast: Secondary | ICD-10-CM | POA: Insufficient documentation
